# Patient Record
Sex: Male | Born: 1960 | Race: White | Hispanic: No | Marital: Married | Smoking: Former smoker
Health system: Southern US, Community
[De-identification: ages and names within clinical notes are randomized; demographics above are authoritative.]

## PROBLEM LIST (undated history)

## (undated) DIAGNOSIS — E119 Type 2 diabetes mellitus without complications: Secondary | ICD-10-CM

## (undated) DIAGNOSIS — M51369 Other intervertebral disc degeneration, lumbar region without mention of lumbar back pain or lower extremity pain: Secondary | ICD-10-CM

## (undated) DIAGNOSIS — I10 Essential (primary) hypertension: Secondary | ICD-10-CM

## (undated) DIAGNOSIS — G4730 Sleep apnea, unspecified: Secondary | ICD-10-CM

## (undated) DIAGNOSIS — D0461 Carcinoma in situ of skin of right upper limb, including shoulder: Secondary | ICD-10-CM

## (undated) DIAGNOSIS — M5136 Other intervertebral disc degeneration, lumbar region: Secondary | ICD-10-CM

## (undated) DIAGNOSIS — G629 Polyneuropathy, unspecified: Secondary | ICD-10-CM

## (undated) DIAGNOSIS — K219 Gastro-esophageal reflux disease without esophagitis: Secondary | ICD-10-CM

## (undated) DIAGNOSIS — G2581 Restless legs syndrome: Secondary | ICD-10-CM

## (undated) HISTORY — PX: SHOULDER ARTHROSCOPY: SHX128

---
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
## 1983-02-13 HISTORY — PX: APPENDECTOMY: SHX54

## 2008-02-13 HISTORY — PX: DIAGNOSTIC LAPAROSCOPY: SUR761

## 2008-03-02 ENCOUNTER — Emergency Department (HOSPITAL_COMMUNITY): Admission: EM | Admit: 2008-03-02 | Discharge: 2008-03-02 | Payer: Self-pay | Admitting: Emergency Medicine

## 2008-12-02 ENCOUNTER — Ambulatory Visit: Payer: Self-pay | Admitting: Surgery

## 2009-01-28 ENCOUNTER — Ambulatory Visit: Payer: Self-pay | Admitting: Surgery

## 2009-10-13 ENCOUNTER — Emergency Department: Payer: Self-pay | Admitting: Emergency Medicine

## 2009-12-22 ENCOUNTER — Emergency Department (HOSPITAL_COMMUNITY)
Admission: EM | Admit: 2009-12-22 | Discharge: 2009-12-22 | Payer: Self-pay | Source: Home / Self Care | Admitting: Emergency Medicine

## 2010-01-19 ENCOUNTER — Ambulatory Visit
Admission: RE | Admit: 2010-01-19 | Discharge: 2010-01-20 | Payer: Self-pay | Source: Home / Self Care | Attending: Orthopedic Surgery | Admitting: Orthopedic Surgery

## 2010-04-25 LAB — BASIC METABOLIC PANEL
Chloride: 103 mEq/L (ref 96–112)
Creatinine, Ser: 0.84 mg/dL (ref 0.4–1.5)
GFR calc Af Amer: >60 mL/min (ref >60)
Sodium: 135 mEq/L (ref 135–145)

## 2010-04-25 LAB — POCT HEMOGLOBIN-HEMACUE: Hemoglobin: 15.6 g/dL (ref 13.0–17.0)

## 2010-05-28 ENCOUNTER — Emergency Department: Payer: Self-pay | Admitting: Emergency Medicine

## 2010-05-29 LAB — COMPREHENSIVE METABOLIC PANEL
AST: 25 U/L (ref 0–37)
Albumin: 4.1 g/dL (ref 3.5–5.2)
Alkaline Phosphatase: 75 U/L (ref 39–117)
BUN: 15 mg/dL (ref 6–23)
Chloride: 109 mEq/L (ref 96–112)
GFR calc Af Amer: >60 mL/min (ref >60)
Potassium: 4.4 mEq/L (ref 3.5–5.1)
Total Bilirubin: 0.8 mg/dL (ref 0.3–1.2)

## 2010-05-29 LAB — CBC
HCT: 43.6 % (ref 39.0–52.0)
Platelets: 314 10*3/uL (ref 150–400)
RBC: 4.76 MIL/uL (ref 4.22–5.81)
WBC: 17.4 10*3/uL — ABNORMAL HIGH (ref 4.0–10.5)

## 2010-05-29 LAB — DIFFERENTIAL
Basophils Absolute: 0.2 10*3/uL — ABNORMAL HIGH (ref 0.0–0.1)
Basophils Relative: 1 % (ref 0–1)
Eosinophils Relative: 2 % (ref 0–5)
Monocytes Absolute: 1.4 10*3/uL — ABNORMAL HIGH (ref 0.1–1.0)

## 2010-07-03 ENCOUNTER — Emergency Department: Payer: Self-pay | Admitting: Emergency Medicine

## 2010-09-25 ENCOUNTER — Encounter (HOSPITAL_BASED_OUTPATIENT_CLINIC_OR_DEPARTMENT_OTHER)
Admission: RE | Admit: 2010-09-25 | Discharge: 2010-09-25 | Disposition: A | Discharge status: Home / Self Care | Payer: Worker's Compensation | Source: Ambulatory Visit | Attending: Orthopedic Surgery | Admitting: Orthopedic Surgery

## 2010-09-25 LAB — BASIC METABOLIC PANEL
BUN: 9 mg/dL (ref 6–23)
Creatinine, Ser: 0.9 mg/dL (ref 0.50–1.35)
GFR calc Af Amer: >60 mL/min (ref >60)
GFR calc non Af Amer: >60 mL/min (ref >60)

## 2010-09-28 ENCOUNTER — Ambulatory Visit (HOSPITAL_BASED_OUTPATIENT_CLINIC_OR_DEPARTMENT_OTHER)
Admission: RE | Admit: 2010-09-28 | Discharge: 2010-09-28 | Disposition: A | Discharge status: Home / Self Care | Payer: Worker's Compensation | Source: Ambulatory Visit | Attending: Orthopedic Surgery | Admitting: Orthopedic Surgery

## 2010-09-28 DIAGNOSIS — J4489 Other specified chronic obstructive pulmonary disease: Secondary | ICD-10-CM | POA: Insufficient documentation

## 2010-09-28 DIAGNOSIS — M67919 Unspecified disorder of synovium and tendon, unspecified shoulder: Secondary | ICD-10-CM | POA: Insufficient documentation

## 2010-09-28 DIAGNOSIS — S43439A Superior glenoid labrum lesion of unspecified shoulder, initial encounter: Secondary | ICD-10-CM | POA: Insufficient documentation

## 2010-09-28 DIAGNOSIS — M899 Disorder of bone, unspecified: Secondary | ICD-10-CM | POA: Insufficient documentation

## 2010-09-28 DIAGNOSIS — M719 Bursopathy, unspecified: Secondary | ICD-10-CM | POA: Insufficient documentation

## 2010-09-28 DIAGNOSIS — K219 Gastro-esophageal reflux disease without esophagitis: Secondary | ICD-10-CM | POA: Insufficient documentation

## 2010-09-28 DIAGNOSIS — Z01812 Encounter for preprocedural laboratory examination: Secondary | ICD-10-CM | POA: Insufficient documentation

## 2010-09-28 DIAGNOSIS — G4733 Obstructive sleep apnea (adult) (pediatric): Secondary | ICD-10-CM | POA: Insufficient documentation

## 2010-09-28 DIAGNOSIS — E669 Obesity, unspecified: Secondary | ICD-10-CM | POA: Insufficient documentation

## 2010-09-28 DIAGNOSIS — M25819 Other specified joint disorders, unspecified shoulder: Secondary | ICD-10-CM | POA: Insufficient documentation

## 2010-09-28 DIAGNOSIS — J449 Chronic obstructive pulmonary disease, unspecified: Secondary | ICD-10-CM | POA: Insufficient documentation

## 2010-09-28 DIAGNOSIS — I10 Essential (primary) hypertension: Secondary | ICD-10-CM | POA: Insufficient documentation

## 2010-09-28 DIAGNOSIS — X58XXXA Exposure to other specified factors, initial encounter: Secondary | ICD-10-CM | POA: Insufficient documentation

## 2010-10-05 NOTE — Op Note (Signed)
  NAMEHARROLD, Steven Nielsen NO.:  [id number]  MEDICAL RECORD NO.:  [id number]  LOCATION:                                 FACILITY:  PHYSICIAN:  Loreta Ave, M.D.      DATE OF BIRTH:  DATE OF PROCEDURE:  09/28/2010 DATE OF DISCHARGE:                              OPERATIVE REPORT   PREOPERATIVE DIAGNOSES:  Left shoulder subacromial impingement. Previous open reduction internal fixation of glenoid fracture as well as arthroscopic Bankart repair.  POSTOPERATIVE DIAGNOSES:  Left shoulder subacromial impingement. Previous open reduction internal fixation of  glenoid fracture as well as arthroscopic Bankart repair with healed fracture in good position. Solid reconstruction.  Now with some complex tearing in the posterior and superior labrum.  Marked subacromial bursitis and distal clavicle osteolysis.  PROCEDURE:  Left shoulder exam under anesthesia arthroscopy.  Assessment of healing of reconstruction and fracture fixation.  Debridement of labrum and adhesions.  Bursectomy, acromioplasty, CA ligament release. Excision of distal clavicle.  Debridement of superior rotator cuff.  SURGEON:  Loreta Ave, MD  ASSISTANT:  Zonia Kief, PA, present throughout the entire case and necessary for timely completion of procedure.  ANESTHESIA:  General  BLOOD LOSS:  Minimal.  SPECIMENS:  None.  COUNTS:  None.  COMPLICATIONS:  None.  DRESSING:  Soft compressive with sling.  PROCEDURE IN DETAILS:  The patient was brought to the operating room and placed on the operating room table in supine position.  After adequate anesthesia has been obtained, shoulder examined.  Stiff but I can get him through fairly good motion without too much trouble.  This is just global stiffness rather than true adhesions.  Placed in beach-chair position on the shoulder positioner, prepped and draped in usual sterile fashion.  Three portals anterior, posterior and lateral.   Arthroscope was introduced.  Shoulder distended and inspected.  Glenoid fracture healed in anatomic position.  Nice and solid without degenerative changes at present.  Complex tearing, posterior and superior labrum debrided.  Remaining articular cartilage looked good.  Bankart reconstruction looked good.  Undersurface of the cuff, biceps tendon, and biceps anchor intact.  Some early adhesions debrided out.  Cannula redirected subacromially.  Type 3 acromion, reactive bursitis, subacromial adhesions and inflammatory debris.  Bursa resected adhesions and inflamed tissue removed.  Cuff debrided.  Some abrasion on the top but no structural tearing.  CA ligament released.  Acromioplasty to a type 1 acromion with a shaver and high-speed bur.  Marked osteolysis grade 3 and 4 changes distal clavicle.  Periarticular spurs and the lateral centimeter of clavicle resected.  Adequacy of decompression confirmed viewing from all portals.  Instruments and fluid removed. Portals closed with nylon.  Sterile compressive dressing applied. Anesthesia reversed.  Brought to the recovery room.  Tolerated the surgery well.  No complications.     Loreta Ave, M.D.     DFM/MEDQ  D:  09/28/2010  T:  09/29/2010  Job:  [id number]  Electronically Signed by Mckinley Jewel M.D. on 10/05/2010 04:15:30 PM

## 2011-02-13 DIAGNOSIS — M109 Gout, unspecified: Secondary | ICD-10-CM

## 2011-02-13 HISTORY — PX: SHOULDER ARTHROSCOPY: SHX128

## 2011-02-13 HISTORY — DX: Gout, unspecified: M10.9

## 2011-03-14 ENCOUNTER — Ambulatory Visit: Payer: Self-pay | Admitting: Family Medicine

## 2013-03-05 DIAGNOSIS — I10 Essential (primary) hypertension: Secondary | ICD-10-CM | POA: Insufficient documentation

## 2013-03-05 DIAGNOSIS — M5136 Other intervertebral disc degeneration, lumbar region: Secondary | ICD-10-CM | POA: Insufficient documentation

## 2013-03-05 DIAGNOSIS — M51369 Other intervertebral disc degeneration, lumbar region without mention of lumbar back pain or lower extremity pain: Secondary | ICD-10-CM | POA: Insufficient documentation

## 2013-03-06 DIAGNOSIS — G6289 Other specified polyneuropathies: Secondary | ICD-10-CM | POA: Insufficient documentation

## 2013-03-08 DIAGNOSIS — E291 Testicular hypofunction: Secondary | ICD-10-CM | POA: Insufficient documentation

## 2013-11-16 HISTORY — PX: COLONOSCOPY: SHX174

## 2016-03-02 DIAGNOSIS — G43109 Migraine with aura, not intractable, without status migrainosus: Secondary | ICD-10-CM | POA: Insufficient documentation

## 2016-03-13 DIAGNOSIS — G4733 Obstructive sleep apnea (adult) (pediatric): Secondary | ICD-10-CM | POA: Insufficient documentation

## 2016-03-13 DIAGNOSIS — E669 Obesity, unspecified: Secondary | ICD-10-CM | POA: Insufficient documentation

## 2016-04-09 ENCOUNTER — Other Ambulatory Visit: Payer: Self-pay | Admitting: Neurology

## 2016-04-09 DIAGNOSIS — G43109 Migraine with aura, not intractable, without status migrainosus: Principal | ICD-10-CM

## 2016-04-09 DIAGNOSIS — G43809 Other migraine, not intractable, without status migrainosus: Secondary | ICD-10-CM

## 2016-04-25 ENCOUNTER — Ambulatory Visit: Payer: Self-pay

## 2016-08-16 ENCOUNTER — Ambulatory Visit: Payer: 59 | Attending: Neurology | Admitting: Physical Therapy

## 2016-08-16 DIAGNOSIS — M542 Cervicalgia: Secondary | ICD-10-CM | POA: Diagnosis not present

## 2016-08-16 DIAGNOSIS — M2560 Stiffness of unspecified joint, not elsewhere classified: Secondary | ICD-10-CM | POA: Diagnosis present

## 2016-08-16 DIAGNOSIS — R293 Abnormal posture: Secondary | ICD-10-CM | POA: Insufficient documentation

## 2016-08-17 NOTE — Therapy (Signed)
[facility] [address] [place], [place], [address] Phone: [phone]   Fax:  [phone]  Physical Therapy Evaluation  Patient Details  Name: Steven Nielsen MRN: [id number] Date of Birth: 12/08/1960 Referring Provider: Dr. Sherryll BurgerShah  Encounter Date: 08/16/2016      PT End of Session - 08/17/16 0901    Visit Number 1   Number of Visits 8   Date for PT Re-Evaluation 09/13/16   PT Start Time 1544   PT Stop Time 1640   PT Time Calculation (min) 56 min   Activity Tolerance Patient tolerated treatment well   Behavior During Therapy [facility] for tasks assessed/performed      No past medical history on file.  No past surgical history on file.  There were no vitals filed for this visit.       Subjective Assessment - 08/16/16 1551    Subjective Pt reports dizziness and migraines with increasing frequency in the last six months. States he would have "episodes" once or twice a year, then once a month, and eventually progressed to every week. States his last major episode was 40 days ago while fishing.  Pt. reports he has a couple MD appts. with specialists scheduled in a few months.     Limitations Walking;Standing   Patient Stated Goals Decrease neck pain/ episodes of vertigo.  Return to fishing with no limitations.    Currently in Pain? Yes   Pain Score 5    Pain Location Neck   Pain Orientation Posterior   Pain Descriptors / Indicators Aching;Sore   Pain Type Chronic pain   Pain Onset More than a month ago   Pain Frequency Constant   Multiple Pain Sites No            [facility] PT Assessment - 08/17/16 0001      Assessment   Medical Diagnosis Neck pain/ Vertigo   Referring Provider Dr. Sherryll BurgerShah   Onset Date/Surgical Date 02/12/14   Hand Dominance Right   Prior Therapy Nothing for neck pain     Precautions   Precautions None     Balance Screen   Has the patient fallen in the past 6 months No     Prior Function   Level of  Independence Independent       See HEP.  Discussed sleeping posture and computer desk ergonomics.  Ice to neck in sitting after tx. For 10 min.         PT Education - 08/17/16 0901    Education provided Yes   Education Details See HEP   Person(s) Educated Patient   Methods Explanation;Demonstration;Handout   Comprehension Verbalized understanding;Returned demonstration             PT Long Term Goals - 08/17/16 0912      PT LONG TERM GOAL #1   Title Pt will decrease his NDI score to less than 10% in order to show a decrease in pain perception and increase in function   Baseline NDI: 18% on 7/5   Time 4   Period Weeks   Status New     PT LONG TERM GOAL #2   Title Pt. will increase his cervical rotation ROM to 55 degrees in order to improve function and increase ability to participate in desired activities   Baseline Cervical flexion (47 deg.), ext. (34 deg.), L rotn. (48 deg.), R rotn. (50 deg.), L lat. flex. (44 deg.), R lat. flex. (42 deg.).   Time  4   Period Weeks   Status New     PT LONG TERM GOAL #3   Title Pt will decrease his pain score to <3/10 in order to increase his ability to participate in recreational acitivities   Baseline 5/10   Time 4   Period Weeks   Status New     PT LONG TERM GOAL #4   Title Pt. will report no episodes of vertigo or dizziness with daily activities and fishing consistently for 1 month.     Time 4   Period Weeks   Status New            Plan - 08/17/16 0902    Clinical Impression Statement Pt. is a 56 y/o with chronic h/o neck pain/ vertigo symptoms resulting in nausea/ vomiting over past 2 years.  Pt. reports symptoms are more frequent and last happened while fishing 40 days ago.  Pt. reports 5/10 neck pain currently at rest.  Pt. presents with good B UE strength grossly 5/5 MMT.  Cervical flexion (47 deg.), ext. (34 deg.), L rotn. (48 deg.), R rotn. (50 deg.), L lat. flex. (44 deg.), R lat. flex. (42 deg.).  Pt. reports  increase pain with R rotn. as compared to L.  Pt. works at [employer] and is aware of proper TEFL teacher.  PT discussed sleeping posture/ head position with pillow use.  Pt. currently not participating with a HEP at this time.  NDI: 18% self-perceived mild disability.  Pt. will benefit from short-term skilled PT services to increase pain-free cervical AROM/ upright posture to decrease incidence of vertigo/dizziness.     Clinical Decision Making Moderate   Rehab Potential Fair   PT Frequency 2x / week   PT Duration 4 weeks   PT Treatment/Interventions ADLs/Self Care Home Management;Cryotherapy;Electrical Stimulation;Iontophoresis 4mg /ml Dexamethasone;Moist Heat;Therapeutic exercise;Therapeutic activities;Functional mobility training;Neuromuscular re-education;Patient/family education;Passive range of motion;Manual techniques;Dry needling;Vestibular   PT Next Visit Plan Reassess cervical AROM/ HEP.  Discuss pt. status with Mardelle Matte, PT, DPT   PT Home Exercise Plan see handouts      Patient will benefit from skilled therapeutic intervention in order to improve the following deficits and impairments:  Improper body mechanics, Pain, Postural dysfunction, Decreased mobility, Decreased activity tolerance, Decreased range of motion, Hypomobility  Visit Diagnosis: Painful cervical range of motion  Neck pain  Joint stiffness  Abnormal posture     Problem List There are no active problems to display for this patient.  Cammie Mcgee, PT, DPT # [phone] 08/17/2016, 9:24 AM  [facility] [address] [place], [place], [address] Phone: [phone]   Fax:  [phone]  Name: Steven Nielsen MRN: [id number] Date of Birth: [birth year]

## 2016-08-23 ENCOUNTER — Ambulatory Visit: Payer: 59 | Admitting: Physical Therapy

## 2016-08-23 DIAGNOSIS — M542 Cervicalgia: Secondary | ICD-10-CM | POA: Diagnosis not present

## 2016-08-23 DIAGNOSIS — R293 Abnormal posture: Secondary | ICD-10-CM

## 2016-08-23 DIAGNOSIS — M2560 Stiffness of unspecified joint, not elsewhere classified: Secondary | ICD-10-CM

## 2016-08-24 NOTE — Therapy (Addendum)
[facility] [address] [place], [place], [address] Phone: [phone]   Fax:  [phone]  Physical Therapy Treatment  Patient Details  Name: Shauna HughDavid W Stanfill MRN: [id number] Date of Birth: 12-30-1960 Referring Provider: Dr. Sherryll BurgerShah  Encounter Date: 08/23/2016      PT End of Session - 08/24/16 1811    Visit Number 2   Number of Visits 8   Date for PT Re-Evaluation 09/13/16   PT Start Time 1618   PT Stop Time 1706   PT Time Calculation (min) 48 min   Activity Tolerance Patient tolerated treatment well   Behavior During Therapy [facility] for tasks assessed/performed      No past medical history on file.  No past surgical history on file.  There were no vitals filed for this visit.      Subjective Assessment - 08/24/16 1810    Subjective Pt. reports a slight HA.  No questions with HEP.     Limitations Walking;Standing   Patient Stated Goals Decrease neck pain/ episodes of vertigo.  Return to fishing with no limitations.    Currently in Pain? No/denies      There.ex.: reviewed HEP.  Chin tucks/ seated scapular retraction.  Discussed Probation officerworkplace ergonomics.    Manual tx.:  Supine cervical stretches (UT/evator/rotn.) 4x each with static holds (as tolerated)- no increase c/o pain.  Supine manual traction/ suboccipital release technique 3x each.  STM to B UT region/ cervical paraspinals/ B posterior deltoid with shoulder stretches.  Prone grade II-III CPA/UPA (low cervical to mid-thoracic region)- 2x20 sec. Each.  Moderate hypomobility with no increase c/o pain.  Discussed ice vs. Heat.         No episodes of dizzness or vertigo since initial evaluation.  Pt. continues to present with increase neck pain with supine R cervical rotn.  Moderate hypomobility in mid to upper thoracic spine.  Good tolerance without pain with supine cervical stretches and manaul traction.  Cuing to correct seated posture/ head position.  No changes to HEP at  this time.  Pt instructed to contact PT if any increase neck pain or vertigo symptoms over next week.         PT Long Term Goals - 08/17/16 0912      PT LONG TERM GOAL #1   Title Pt will decrease his NDI score to less than 10% in order to show a decrease in pain perception and increase in function   Baseline NDI: 18% on 7/5   Time 4   Period Weeks   Status New     PT LONG TERM GOAL #2   Title Pt. will increase his cervical rotation ROM to 55 degrees in order to improve function and increase ability to participate in desired activities   Baseline Cervical flexion (47 deg.), ext. (34 deg.), L rotn. (48 deg.), R rotn. (50 deg.), L lat. flex. (44 deg.), R lat. flex. (42 deg.).   Time 4   Period Weeks   Status New     PT LONG TERM GOAL #3   Title Pt will decrease his pain score to <3/10 in order to increase his ability to participate in recreational acitivities   Baseline 5/10   Time 4   Period Weeks   Status New     PT LONG TERM GOAL #4   Title Pt. will report no episodes of vertigo or dizziness with daily activities and fishing consistently for 1 month.     Time 4  Period Weeks   Status New               Plan - 08/24/16 1812    Clinical Decision Making Moderate   Rehab Potential Fair   PT Frequency 2x / week   PT Duration 4 weeks   PT Treatment/Interventions ADLs/Self Care Home Management;Cryotherapy;Electrical Stimulation;Iontophoresis 4mg /ml Dexamethasone;Moist Heat;Therapeutic exercise;Therapeutic activities;Functional mobility training;Neuromuscular re-education;Patient/family education;Passive range of motion;Manual techniques;Dry needling;Vestibular   PT Next Visit Plan Reassess cervical AROM/ HEP.     PT Home Exercise Plan see handouts      Patient will benefit from skilled therapeutic intervention in order to improve the following deficits and impairments:  Improper body mechanics, Pain, Postural dysfunction, Decreased mobility, Decreased activity  tolerance, Decreased range of motion, Hypomobility  Visit Diagnosis: Painful cervical range of motion  Neck pain  Joint stiffness  Abnormal posture     Problem List There are no active problems to display for this patient.  Cammie Mcgee, PT, DPT # [phone] 08/24/2016, 6:13 PM  [facility] [address] [place], [place], [address] Phone: [phone]   Fax:  [phone]  Name: TORRIE LAFAVOR MRN: [id number] Date of Birth: 1960/10/29

## 2016-08-30 ENCOUNTER — Ambulatory Visit: Payer: 59 | Admitting: Physical Therapy

## 2016-08-30 DIAGNOSIS — M542 Cervicalgia: Secondary | ICD-10-CM | POA: Diagnosis not present

## 2016-08-30 DIAGNOSIS — R293 Abnormal posture: Secondary | ICD-10-CM

## 2016-08-30 DIAGNOSIS — M2560 Stiffness of unspecified joint, not elsewhere classified: Secondary | ICD-10-CM

## 2016-08-31 NOTE — Therapy (Addendum)
[place] [facility] [address] [place], [place], [address] Phone: [phone]   Fax:  [phone]  Physical Therapy Treatment  Patient Details  Name: Steven Nielsen MRN: [id number] Date of Birth: Jun 10, 1960 Referring Provider: Dr. Manuella Ghazi  Encounter Date: 08/30/2016      PT End of Session - 08/31/16 1631    Visit Number 3   Number of Visits 8   Date for PT Re-Evaluation 09/13/16   PT Start Time 5366   PT Stop Time 1709   PT Time Calculation (min) 52 min   Activity Tolerance Patient tolerated treatment well   Behavior During Therapy [facility] for tasks assessed/performed      No past medical history on file.  No past surgical history on file.  There were no vitals filed for this visit.      Subjective Assessment - 08/31/16 1630    Limitations Walking;Standing   Patient Stated Goals Decrease neck pain/ episodes of vertigo.  Return to fishing with no limitations.    Currently in Pain? No/denies      Pt. remains fearful of going fishing due to potential of having a vertigo attack.  No increase c/o pain reported with slight HA this morning.  No c/o neck pain at this time but >5/10 with increase activity.       Manual tx.:  Supine cervical stretches (UT/evator/rotn.)- 14 min. with no increase c/o pain or symptoms.  Supine manual traction/ suboccipital release technique 3x each.  STM to B UT region/ cervical paraspinals/ B posterior deltoid with shoulder stretches.  Prone grade II-III CPA/UPA (low cervical to mid-thoracic region)- 2x20 sec. Each.  Moderate hypomobility with no increase c/o pain.        Increase cervical lateral flexion/ rotn. in seated posture.  Pt. continues to present with rounded shoulder/ tight pec. musculature.  Moderate hypomobility in low cervical to mid-thoracic spine.  No symptoms of vertigo/ dizziness or nausea with position changes or manual tx.  Pt. has had no worsening of symptoms since starting PT and may  benefit from referral to Lady Deutscher, PT, DPT.  No change to HEP at this time.  Pt. instructed to remain active and contact PT if any symptoms or next week.           PT Long Term Goals - 08/30/16 1624      PT LONG TERM GOAL #1   Title Pt will decrease his NDI score to less than 10% in order to show a decrease in pain perception and increase in function   Baseline NDI: 18% on 7/5   Time 4   Period Weeks   Status On-going     PT LONG TERM GOAL #2   Title Pt. will increase his cervical rotation ROM to 55 degrees in order to improve function and increase ability to participate in desired activities   Baseline Cervical flexion (55 deg.), ext. (44 deg.), L rotn. (60 deg.), R rotn. (62 deg.), L lat. flex. (46 deg.), R lat. flex. (44 deg.).   Time 4   Period Weeks   Status Partially Met     PT LONG TERM GOAL #3   Title Pt will decrease his pain score to <3/10 in order to increase his ability to participate in recreational acitivities   Baseline 3/10 pain currently.  Pt. reports pain is better but still present.  No migraine pills required.     Time 4   Period Weeks   Status Partially Met  PT LONG TERM GOAL #4   Title Pt. will report no episodes of vertigo or dizziness with daily activities and fishing consistently for 1 month.     Time 4   Period Weeks   Status On-going               Plan - 08/31/16 1631    Clinical Presentation Stable   Clinical Decision Making Moderate   Rehab Potential Fair   PT Frequency 2x / week   PT Duration 4 weeks   PT Treatment/Interventions ADLs/Self Care Home Management;Cryotherapy;Electrical Stimulation;Iontophoresis 7m/ml Dexamethasone;Moist Heat;Therapeutic exercise;Therapeutic activities;Functional mobility training;Neuromuscular re-education;Patient/family education;Passive range of motion;Manual techniques;Dry needling;Vestibular   PT Next Visit Plan Issue new HEP   PT Home Exercise Plan see handouts      Patient will  benefit from skilled therapeutic intervention in order to improve the following deficits and impairments:  Improper body mechanics, Pain, Postural dysfunction, Decreased mobility, Decreased activity tolerance, Decreased range of motion, Hypomobility  Visit Diagnosis: Painful cervical range of motion  Neck pain  Joint stiffness  Abnormal posture     Problem List There are no active problems to display for this patient.  MPura Spice PT, DPT # [phone]/20/2018, 4:33 PM  [place] [facility] [address]Phone: [phone]  Fax:  [phone] Name: Steven BOTELHOMRN: [id number] of Birth: 6November 29, 1962

## 2016-09-06 ENCOUNTER — Ambulatory Visit: Payer: 59 | Admitting: Physical Therapy

## 2016-09-13 ENCOUNTER — Encounter: Payer: Self-pay | Admitting: Physical Therapy

## 2016-09-20 ENCOUNTER — Ambulatory Visit: Payer: 59 | Attending: Neurology | Admitting: Physical Therapy

## 2016-09-20 DIAGNOSIS — R293 Abnormal posture: Secondary | ICD-10-CM | POA: Diagnosis present

## 2016-09-20 DIAGNOSIS — M542 Cervicalgia: Secondary | ICD-10-CM | POA: Diagnosis not present

## 2016-09-20 DIAGNOSIS — M2560 Stiffness of unspecified joint, not elsewhere classified: Secondary | ICD-10-CM | POA: Diagnosis present

## 2016-09-21 NOTE — Therapy (Addendum)
[facility] [address] [place], [place], [address] Phone: [phone]   Fax:  [phone]  Physical Therapy Treatment  Patient Details  Name: Steven Nielsen MRN: [id number] Date of Birth: 18-Sep-1960 Referring Provider: Dr. Sherryll Burger  Encounter Date: 09/20/2016      PT End of Session - 09/21/16 1545    Visit Number 4   Number of Visits 8   Date for PT Re-Evaluation 10/18/16   PT Start Time 1621   PT Stop Time 1711   PT Time Calculation (min) 50 min   Activity Tolerance Patient tolerated treatment well   Behavior During Therapy [facility] for tasks assessed/performed      No past medical history on file.  No past surgical history on file.  There were no vitals filed for this visit.      Subjective Assessment - 09/21/16 1545    Limitations Walking;Standing   Patient Stated Goals Decrease neck pain/ episodes of vertigo.  Return to fishing with no limitations.    Currently in Pain? No/denies      Pt. not seen by PT since 7/19 due to increase dizziness/ nausea symptoms on 09/05/16.  Pt. reports symptoms persisted over the weekend and pt. attempted to return to work on Monday but had to leave early.  No c/o neck pain today or dizziness.         Manual tx.: Supine cervical stretches (UT/evator/rotn.)- 15 min. with no increase c/o pain or symptoms. Supine manual traction/ suboccipital release technique 3x each. STM to B UT region/ cervical paraspinals/ B posterior deltoid with shoulder stretches. Prone grade II-III CPA/UPA (low cervical to mid-thoracic region)- 2x20 sec. Each. Moderate hypomobility with no increase c/o pain.  Discussed there.ex./ HEP.        Pt. entered PT with no significant c/o neck pain and reports limited compliance with HEP secondary to recent episode of dizziness/ nausea.  Pt. is unaware of the cause of the recent dizziness when he woke up in the morning.  Pt. has seen MD and prescribed new medications.   Cervical AROM remains limited and moderate hypomobility remains with prone PA mobs.  Unable to reproduce symptoms with trunk rotations on stool while keeping head in midline/ neutral position.          PT Long Term Goals - 08/30/16 1624      PT LONG TERM GOAL #1   Title Pt will decrease his NDI score to less than 10% in order to show a decrease in pain perception and increase in function   Baseline NDI: 18% on 7/5   Time 4   Period Weeks   Status On-going     PT LONG TERM GOAL #2   Title Pt. will increase his cervical rotation ROM to 55 degrees in order to improve function and increase ability to participate in desired activities   Baseline Cervical flexion (55 deg.), ext. (44 deg.), L rotn. (60 deg.), R rotn. (62 deg.), L lat. flex. (46 deg.), R lat. flex. (44 deg.).   Time 4   Period Weeks   Status Partially Met     PT LONG TERM GOAL #3   Title Pt will decrease his pain score to <3/10 in order to increase his ability to participate in recreational acitivities   Baseline 3/10 pain currently.  Pt. reports pain is better but still present.  No migraine pills required.     Time 4   Period Weeks   Status Partially Met  PT LONG TERM GOAL #4   Title Pt. will report no episodes of vertigo or dizziness with daily activities and fishing consistently for 1 month.     Time 4   Period Weeks   Status On-going               Plan - 09/21/16 1546    Clinical Presentation Stable   Clinical Decision Making Moderate   Rehab Potential Fair   PT Frequency 2x / week   PT Duration 4 weeks   PT Treatment/Interventions ADLs/Self Care Home Management;Cryotherapy;Electrical Stimulation;Iontophoresis '4mg'$ /ml Dexamethasone;Moist Heat;Therapeutic exercise;Therapeutic activities;Functional mobility training;Neuromuscular re-education;Patient/family education;Passive range of motion;Manual techniques;Dry needling;Vestibular   PT Next Visit Plan Dorriea vestibular screen   PT Home Exercise Plan  see handouts      Patient will benefit from skilled therapeutic intervention in order to improve the following deficits and impairments:  Improper body mechanics, Pain, Postural dysfunction, Decreased mobility, Decreased activity tolerance, Decreased range of motion, Hypomobility  Visit Diagnosis: Painful cervical range of motion  Neck pain  Joint stiffness  Abnormal posture     Problem List There are no active problems to display for this patient.  Pura Spice, PT, DPT # [phone] 09/21/2016, 3:47 PM  [facility] [address] [place], [place], [address] Phone: [phone]   Fax:  [phone]  Name: Steven Nielsen MRN: [id number] Date of Birth: 1961-01-29

## 2016-09-27 ENCOUNTER — Ambulatory Visit: Payer: 59 | Admitting: Physical Therapy

## 2016-09-27 DIAGNOSIS — M2560 Stiffness of unspecified joint, not elsewhere classified: Secondary | ICD-10-CM

## 2016-09-27 DIAGNOSIS — M542 Cervicalgia: Secondary | ICD-10-CM | POA: Diagnosis not present

## 2016-09-27 DIAGNOSIS — R293 Abnormal posture: Secondary | ICD-10-CM

## 2016-09-28 NOTE — Therapy (Addendum)
[facility], [place] [address] [place], [place], [address] Phone: [phone]   Fax:  [phone]  Physical Therapy Treatment  Patient Details  Name: Steven Nielsen MRN: [id number] Date of Birth: 22-Jun-1960 Referring Provider: Dr. Sherryll Burger  Encounter Date: 09/27/2016      PT End of Session - 09/28/16 1741    Visit Number 5   Number of Visits 8   Date for PT Re-Evaluation 10/18/16   PT Start Time 1605   PT Stop Time 1707   PT Time Calculation (min) 62 min   Activity Tolerance Patient tolerated treatment well   Behavior During Therapy [facility] for tasks assessed/performed      No past medical history on file.  No past surgical history on file.  There were no vitals filed for this visit.    Pt. new complaints.  Pt. denies neck pain today and PT is planning on screening for vestibular symptoms.        VESTIBULAR AND BALANCE EVALUATION      HISTORY:   Subjective history of current problem:    Patient reports that he first began having episodes of dizziness 7-9 years ago and states that he would usually only have an episode once a year usually in June. Patient states that then he started to have the episodes of dizziness occur 2 times a year and now it has started to occur every few months. Patient reports he had a minor episode 3 weeks ago and about 4 weeks prior to that he had a big episode and a few weeks prior to that had another significant episode while he was out on a fishing trip. Patient states that he returned to the neurologist's office, Dr. Clelia Croft, after the fishing episode trip and states he was prescribed gabapentin. Patient reports he has been taking gabapentin once a day but is supposed to start taking it twice a day and he is going to start that this weekend. Patient states that when he has an episode he tries to lie down but states he cannot close his eyes because the spinning gets worse. Patient states he lies still and cannot  get up even to use the restroom initially. Patient reports that when he experiences an episode, he takes his medications and sleeps for 18 hours and after that he still feels imbalanced, dizzy and a "hang-over feeling". Patient reports that he takes Valium, Meclizine and Salara.  Patient reports that he has seen a neurologist and has been diagnosed with vestibular migraines. Patient reports he feels he does not have typical migraines. Patient had an MRI of the brain on 05/04/2016 and per MR it was unremarkable MRI of brain. Patient reports that the brain MRI showed no abnormal findings. Patient reports he also had a cervical CT scan which revealed some cervical issues.  Patient has been seen by Dr. Elenore Rota, ENT physician in regard to his dizziness symptoms. Patient described having had a hearing test and a VNG test all were negative.   Patient reports issues with his cervical spine started about 3-4 months ago and states that he has a persistent tightness in his left upper trapezius muscle with" a pulling sensation".   Patient reports that he has been doing a Paleo diet for a year and does not eat gluten. Patient reports he has been trying the Paleo diet to see if it impacts his peripheral neuropathy or not. Patient reports he does not drink sodas, drinks regular coffee in the morning  and decaf at night. Patient reports he does not eat chocolate or drink wine.    Description of dizziness: vertigo, unsteadiness, lightheadedness, sweating; patient reports he does get aural fullness only during the last few episodes- "mostly pressure in the left ear"  Frequency: it has become more frequent and has been occurring monthly  Duration: "bad episodes last 2-3 days"; the vertigo lasts a day but then he experiences imbalance the 2nd day and 3rd day reports he feels "like a hangover"  Symptom nature: spontaneous, variable, intermittent    Provocative Factors: no triggers, but [place] the [place]  he had just sprayed on a strong smelling bug spray, but states he has never before had a trigger with smells before that he has noticed. Patient reports he cannot find a pattern. He had just bent down to get a fish and stood back up and he got the vertigo. Patient states this is the only time that it occurred with a position change. Another time he pulled out of the parking lot and he started to have an episode. Patient notices if he looks over his left shoulder he gets "a strange feeling" and avoids this movement as he is concerned that the dizziness might come on.    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 States the tinnitus existed prior to his first episodes of dizziness  Vision (last eye exam, diplopia, recent changes): denies; pt reports that he had a complete eye examination this past year and stated only a small change in his prescription with no other findings. Patient wears glasses.      EXAMINATION        COORDINATION:  Finger to Nose:  Normal  Past Pointing: Normal    MUSCULOSKELETAL SCREEN:  Cervical Spine ROM: WFL AROM cervical left, right rotation and flexion and extension but did note decreased left rotation as compared to the right. Patient reports discomfort and tightness in his left upper trap area with all of the above-mentioned cervical motions.     Gait: Patient ambulates with fair cadence without AD with fair scanning of visual environment.     Balance:  Patient demonstrates difficulty with ambulation with horizontal head turns, retro ambulation, feet together on firm and foam surfaces and increased difficulty with narrow BOS, uneven surfaces and EC activities.    POSTURAL CONTROL TESTS:    Clinical Test of Sensory Interaction for Balance     (CTSIB):    CONDITION  TIME  STRATEGY  SWAY   Eyes open, firm surface             30 seconds       Eyes closed, firm surface  30 seconds  ankle  +1   Eyes open, foam surface  30 seconds  ankle  +1   Eyes closed, foam surface  30 seconds  ankle  +2       OCULOMOTOR / VESTIBULAR TESTING:    Oculomotor Exam- Room Light    Normal  Abnormal  Comments   Ocular Alignment  normal       Ocular ROM  normal       Spontaneous Nystagmus  normal       End-Gaze Nystagmus  normal  Smooth Pursuit  N       Saccades    Abn  Hypometric saccades   VOR    Abn  Dizziness and blurring of target   VOR Cancellation  N       Left Head Thrust      Deferred secondary to neck discomfort   Right Head Thrust      Deferred secondary to neck discomfort   Head Shaking Nystagmus    Abn  4-5/10 dizziness without nystagmus observed       BPPV TESTS:    Symptoms  Duration  Intensity  Nystagmus   L Dix-Hallpike  none  N/A    None observed   R Dix-Hallpike  none    N/A      None observed   L Head Roll  none  N/A    None observed   R Head Roll  none  N/A    None observed     Neuromuscular Re-education:  Performed DGI and patient scored 21/24. Patient scored a 1 on item # 3. as patient demonstrated veering and change in gait speed with ambulation with horizontal head turns and scored a 2 on item #8. As he used rail with stairs.  Ambulation with head turns:   Patient performed 150' forwards ambulation while scanning for targets in hallway and patient denied increase in symptoms. Patient performed 150' trials of forwards ambulation with horizontal and vertical head turns with CGA. Patient performed 150' trial of retro ambulation with horizontal head turns with CGA. Patient reported dizziness and noted multiple episodes of veering and imbalance that patient was able to self-correct.   Body Wall Rolls:   Patient performed 2 reps of supported, body wall rolls with eyes open and one rep with EC. Patient reports  increase in dizziness with this activity and noted imbalance.   VOR X 1 exercise:   Demonstrated and educated as to VOR X1.   Patient performed VOR X 1 horizontal in sitting 2 reps of 1 minute each with verbal cues for technique.   Patient reports the target is blurring at times and needed to decrease speed and reports 4/10 dizziness.   Issued VOR X 1 for HEP. Handout provided. Information from [email or web address] provided on vestibular migraines including migraine diet.          PT Long Term Goals - 08/30/16 1624      PT LONG TERM GOAL #1   Title Pt will decrease his NDI score to less than 10% in order to show a decrease in pain perception and increase in function   Baseline NDI: 18% on 7/5   Time 4   Period Weeks   Status On-going     PT LONG TERM GOAL #2   Title Pt. will increase his cervical rotation ROM to 55 degrees in order to improve function and increase ability to participate in desired activities   Baseline Cervical flexion (55 deg.), ext. (44 deg.), L rotn. (60 deg.), R rotn. (62 deg.), L lat. flex. (46 deg.), R lat. flex. (44 deg.).   Time 4   Period Weeks   Status Partially Met     PT LONG TERM GOAL #3   Title Pt will decrease his pain score to <3/10 in order to increase his ability to participate in recreational acitivities   Baseline 3/10 pain currently.  Pt. reports pain is better but still present.  No migraine pills required.     Time 4   Period Weeks  Status Partially Met     PT LONG TERM GOAL #4   Title Pt. will report no episodes of vertigo or dizziness with daily activities and fishing consistently for 1 month.     Time 4   Period Weeks   Status On-going       Patient will benefit from skilled therapeutic intervention in order to improve the following deficits and impairments:     Visit Diagnosis: Painful cervical range of motion  Neck pain  Joint stiffness  Abnormal posture     Problem List There are no active problems to display for this  patient.  Pura Spice, PT, DPT # [id number] 09/28/2016, 5:43 PM  [facility] [address] [place], [place], [address] Phone: [phone]   Fax:  [phone]  Name: Steven Nielsen MRN: [id number] Date of Birth: 08-Jan-1961

## 2016-10-02 ENCOUNTER — Ambulatory Visit: Payer: 59 | Admitting: Physical Therapy

## 2016-10-02 DIAGNOSIS — M542 Cervicalgia: Secondary | ICD-10-CM | POA: Diagnosis not present

## 2016-10-02 DIAGNOSIS — R293 Abnormal posture: Secondary | ICD-10-CM

## 2016-10-02 DIAGNOSIS — M2560 Stiffness of unspecified joint, not elsewhere classified: Secondary | ICD-10-CM

## 2016-10-03 NOTE — Therapy (Addendum)
[facility] [address] [place], [place], [address] Phone: [phone]   Fax:  [phone]  Physical Therapy Treatment  Patient Details  Name: CRAWFORD TAMURA MRN: [id number] Date of Birth: [birth year] Referring Provider: Dr. Manuella Ghazi  Encounter Date: 10/02/2016      PT End of Session - 10/03/16 1728    Visit Number 6   Number of Visits 8   Date for PT Re-Evaluation 10/18/16   PT Start Time 1625   PT Stop Time 1720   PT Time Calculation (min) 55 min   Activity Tolerance Patient tolerated treatment well   Behavior During Therapy [facility] for tasks assessed/performed      No past medical history on file.  No past surgical history on file.  There were no vitals filed for this visit.      Subjective Assessment - 10/03/16 1720    Subjective Pt. reports feeling tight in neck/upper back over past couple days.  No episodes of dizziness/ nausea after last tx./ vestibular screen.     Limitations Walking;Standing   Patient Stated Goals Decrease neck pain/ episodes of vertigo.  Return to fishing with no limitations.    Currently in Pain? No/denies        Manual tx.: Supine cervical stretches (UT/evator/rotn.)- 15 min. withno increase c/o pain or symptoms. Supine manual traction/ suboccipital release technique 3x each. STM to B UT region/ cervical paraspinals/ B posterior deltoid with shoulder stretches. Prone grade II-III CPA/UPA (low cervical to mid-thoracic region)- 2x20 sec. Each. Moderate hypomobility with no increase c/o pain.  Discussed there.ex./ HEP.        Moderate upper/mid-thoracic hypomobility with no pain reported during manual tx./ mobs.  Good understanding of HEP and posture correction.  PT encouraged pt. to continue with daily HEP/ vestibular ex. from [place].  No HA or pain reported during tx. session.         PT Long Term Goals - 08/30/16 1624      PT LONG TERM GOAL #1   Title Pt will decrease his NDI score  to less than 10% in order to show a decrease in pain perception and increase in function   Baseline NDI: 18% on 7/5   Time 4   Period Weeks   Status On-going     PT LONG TERM GOAL #2   Title Pt. will increase his cervical rotation ROM to 55 degrees in order to improve function and increase ability to participate in desired activities   Baseline Cervical flexion (55 deg.), ext. (44 deg.), L rotn. (60 deg.), R rotn. (62 deg.), L lat. flex. (46 deg.), R lat. flex. (44 deg.).   Time 4   Period Weeks   Status Partially Met     PT LONG TERM GOAL #3   Title Pt will decrease his pain score to <3/10 in order to increase his ability to participate in recreational acitivities   Baseline 3/10 pain currently.  Pt. reports pain is better but still present.  No migraine pills required.     Time 4   Period Weeks   Status Partially Met     PT LONG TERM GOAL #4   Title Pt. will report no episodes of vertigo or dizziness with daily activities and fishing consistently for 1 month.     Time 4   Period Weeks   Status On-going            Plan - 10/03/16 1729    Clinical Presentation  Stable   Clinical Decision Making Moderate   Rehab Potential Fair   PT Frequency 2x / week   PT Duration 4 weeks   PT Treatment/Interventions ADLs/Self Care Home Management;Cryotherapy;Electrical Stimulation;Iontophoresis '4mg'$ /ml Dexamethasone;Moist Heat;Therapeutic exercise;Therapeutic activities;Functional mobility training;Neuromuscular re-education;Patient/family education;Passive range of motion;Manual techniques;Dry needling;Vestibular   PT Next Visit Plan Reassess cervical ROM/ mobility.  Progress HEP.     PT Home Exercise Plan see handouts      Patient will benefit from skilled therapeutic intervention in order to improve the following deficits and impairments:  Improper body mechanics, Pain, Postural dysfunction, Decreased mobility, Decreased activity tolerance, Decreased range of motion,  Hypomobility  Visit Diagnosis: Painful cervical range of motion  Neck pain  Joint stiffness  Abnormal posture     Problem List There are no active problems to display for this patient.  Pura Spice, PT, DPT # [id number] 10/03/2016, 5:30 PM  [facility] [address] [place], [place], [address] Phone: [phone]   Fax:  [phone]  Name: BRENTYN SEEHAFER MRN: [id number] Date of Birth: 01/18/1961

## 2016-10-11 ENCOUNTER — Ambulatory Visit: Payer: 59 | Admitting: Physical Therapy

## 2016-10-11 DIAGNOSIS — R293 Abnormal posture: Secondary | ICD-10-CM

## 2016-10-11 DIAGNOSIS — M542 Cervicalgia: Secondary | ICD-10-CM | POA: Diagnosis not present

## 2016-10-11 DIAGNOSIS — M2560 Stiffness of unspecified joint, not elsewhere classified: Secondary | ICD-10-CM

## 2016-10-12 NOTE — Therapy (Addendum)
[facility] [address] [place], [place], [address] Phone: [phone]   Fax:  [phone]  Physical Therapy Treatment  Patient Details  Name: ZOHAIB HEENEY MRN: [id number] Date of Birth: 1961/01/18 Referring Provider: Dr. Manuella Ghazi  Encounter Date: 10/11/2016      PT End of Session - 10/12/16 1631    Visit Number 7   Number of Visits 8   Date for PT Re-Evaluation 10/18/16   PT Start Time 5643   PT Stop Time 3295   PT Time Calculation (min) 53 min   Activity Tolerance Patient tolerated treatment well   Behavior During Therapy [facility] for tasks assessed/performed      No past medical history on file.  No past surgical history on file.  There were no vitals filed for this visit.      Subjective Assessment - 10/12/16 1629    Subjective No episdoes of dizziness or nausea over past week.  Pt. compliant with cervical stretches.     Limitations Walking;Standing   Patient Stated Goals Decrease neck pain/ episodes of vertigo.  Return to fishing with no limitations.    Currently in Pain? No/denies      There.ex.: Supine pec stretches/ horizontal sh. Abduction 10x with holds.  No pain.   Chin tucks in supine (manual feedback).   Manual tx.:  Supine cervical stretches (UT/evator/rotn.)- 9 min. withno increase c/o pain or symptoms. Supine manual traction/ suboccipital release technique 3x each. STM to B UT region/ cervical paraspinals/ B posterior deltoid with shoulder stretches. Prone grade II-III CPA/UPA (low cervical to mid-thoracic region)- 2x20 sec. Each. Hypomobility present.     PT reviewed current vestibular ex. Program.  Will benefit from f/u with Dorriea.      Pt. has shown a slow but consistent increase in cervical AROM all planes of movement, esp. rotn.  Moderate upper to mid.-thoracic hypomobility noted during PA mobs. in prone position.  No increase c/o pain or radicular symptoms noted.  Pt. will benefit from a f/u  visit with Lady Deutscher to progress vestibular ex. program.         PT Long Term Goals - 08/30/16 1624      PT LONG TERM GOAL #1   Title Pt will decrease his NDI score to less than 10% in order to show a decrease in pain perception and increase in function   Baseline NDI: 18% on 7/5   Time 4   Period Weeks   Status On-going     PT LONG TERM GOAL #2   Title Pt. will increase his cervical rotation ROM to 55 degrees in order to improve function and increase ability to participate in desired activities   Baseline Cervical flexion (55 deg.), ext. (44 deg.), L rotn. (60 deg.), R rotn. (62 deg.), L lat. flex. (46 deg.), R lat. flex. (44 deg.).   Time 4   Period Weeks   Status Partially Met     PT LONG TERM GOAL #3   Title Pt will decrease his pain score to <3/10 in order to increase his ability to participate in recreational acitivities   Baseline 3/10 pain currently.  Pt. reports pain is better but still present.  No migraine pills required.     Time 4   Period Weeks   Status Partially Met     PT LONG TERM GOAL #4   Title Pt. will report no episodes of vertigo or dizziness with daily activities and fishing consistently for 1  month.     Time 4   Period Weeks   Status On-going               Plan - 10/12/16 1632    Clinical Presentation Stable   Clinical Decision Making Moderate   Rehab Potential Fair   PT Frequency 2x / week   PT Duration 4 weeks   PT Treatment/Interventions ADLs/Self Care Home Management;Cryotherapy;Electrical Stimulation;Iontophoresis 65m/ml Dexamethasone;Moist Heat;Therapeutic exercise;Therapeutic activities;Functional mobility training;Neuromuscular re-education;Patient/family education;Passive range of motion;Manual techniques;Dry needling;Vestibular   PT Next Visit Plan Pt. will see Dorriea next tx. session.  Determine if further tx. sessions needed vs. indepedent program.  Recert vs. discharge next tx. session.  Check insurance/ out of pocket per  pt. request.    PT Home Exercise Plan see handouts      Patient will benefit from skilled therapeutic intervention in order to improve the following deficits and impairments:  Improper body mechanics, Pain, Postural dysfunction, Decreased mobility, Decreased activity tolerance, Decreased range of motion, Hypomobility  Visit Diagnosis: Painful cervical range of motion  Neck pain  Joint stiffness  Abnormal posture     Problem List There are no active problems to display for this patient.  MPura Spice PT, DPT # [id number]/31/2018, 4:34 PM  [facility] [facility] [address]Phone: [phone]  Fax:  [phone] Name: DMICHAI DIEPPAMRN: [id number] of Birth: [birth year]

## 2016-10-18 ENCOUNTER — Encounter: Payer: 59 | Admitting: Physical Therapy

## 2016-10-25 ENCOUNTER — Ambulatory Visit: Payer: 59 | Attending: Neurology | Admitting: Physical Therapy

## 2016-12-05 DIAGNOSIS — G2581 Restless legs syndrome: Secondary | ICD-10-CM | POA: Insufficient documentation

## 2017-08-22 ENCOUNTER — Ambulatory Visit: Payer: 59 | Admitting: Physical Therapy

## 2017-08-26 ENCOUNTER — Other Ambulatory Visit: Payer: Self-pay

## 2017-08-26 ENCOUNTER — Ambulatory Visit: Payer: 59 | Attending: Neurology | Admitting: Physical Therapy

## 2017-08-26 ENCOUNTER — Encounter: Payer: Self-pay | Admitting: Physical Therapy

## 2017-08-26 DIAGNOSIS — R293 Abnormal posture: Secondary | ICD-10-CM | POA: Insufficient documentation

## 2017-08-26 DIAGNOSIS — M542 Cervicalgia: Secondary | ICD-10-CM | POA: Insufficient documentation

## 2017-08-26 DIAGNOSIS — M2560 Stiffness of unspecified joint, not elsewhere classified: Secondary | ICD-10-CM | POA: Insufficient documentation

## 2017-08-26 NOTE — Patient Instructions (Signed)
Access Code: [email or web address]  URL: [email or web address]  Date: 08/26/2017  Prepared by: Dorene GrebeMichael Mitsugi Schrader   Exercises  Seated Assisted Cervical Rotation with Towel - 10 reps - 2 sets - 1x daily - 4x weekly  Cervical Extension AROM with Strap - 10 reps - 2 sets - 1x daily - 4x weekly  Seated Upper Trapezius Stretch - 10 reps - 2 sets - 1x daily - 4x weekly  Seated Cervical Retraction - 10 reps - 2 sets - 1x daily - 4x weekly

## 2017-08-26 NOTE — Therapy (Signed)
[facility] [address] [place], [place], [address] Phone: [phone]   Fax:  [phone]  Physical Therapy Evaluation  Patient Details  Name: Steven Nielsen MRN: [id number] Date of Birth: 09-18-1960 No data recorded  Encounter Date: 08/26/2017  PT End of Session - 08/26/17 1709    Visit Number  1    Number of Visits  8    Date for PT Re-Evaluation  09/23/17    PT Start Time  1557    PT Stop Time  1653    PT Time Calculation (min)  56 min    Activity Tolerance  Patient tolerated treatment well    Behavior During Therapy  [facility] for tasks assessed/performed       History reviewed. No pertinent past medical history.  History reviewed. No pertinent surgical history.  There were no vitals filed for this visit.   Subjective Assessment - 08/26/17 1557    Subjective  Pt. is pleasant 57 y.o. seeking therapy for neck and back pain.  PT assisted last year, however vertigo consistently was worsened and pt. has been having off/on migraines consistently now.      Limitations  Walking;Standing    Patient Stated Goals  Muscles relaxed in neck and back.    Currently in Pain?  Yes    Pain Score  6     Pain Location  Neck    Pain Orientation  Left    Pain Descriptors / Indicators  Tightness    Pain Type  Chronic pain    Pain Onset  More than a month ago    Pain Frequency  Constant    Aggravating Factors   Turning, lying on pillow on that side, looking over to see blind spots when driving.    Pain Relieving Factors  Ibuprofen with pain, but it's always there.    Multiple Pain Sites  Yes    Pain Score  8    Pain Location  Back    Pain Orientation  Right;Lower    Pain Descriptors / Indicators  Sharp;Tiring    Pain Type  Acute pain    Pain Onset  In the past 7 days    Pain Frequency  Intermittent    Aggravating Factors   Sitting for prolonged periods of time.    Pain Relieving Factors  Moving.  Ice.         EVALUATION  Pain Present: 6/10 Best: 5/10 Worst: 8/10   Posture Kyphotic posture Rounded shoulders   Gait / Mobility Decreased trunk rotation Decreased arm swing Stride lengths are WNL   ROM / MMT  Cervical Flex  48 deg Ext  31 deg L Lat  31 deg   R Lat  22 deg  L Rot  41 deg R Rot  42 deg    Traps  5/5  Shoulder L R Abduction 4+ 5 Flex  4+ 5 Biceps  5 5 Triceps 4+ 4+  Grip Strength L R   89.3 88.2  Pt. Is hypomobile in thoracic spine with Grade II-III unilateral/central PA's and hypermobile in upper lumbar region. PA's did not reproduce any of sx response.      See HEP   PT Long Term Goals - 08/26/17 1758      PT LONG TERM GOAL #1   Title  Pt. will increase FOTO to 64 to signify an increase in function and pain reduction.    Baseline  FOTO: 47 on 7/15  Time  4    Period  Weeks    Status  New    Target Date  09/23/17      PT LONG TERM GOAL #2   Title  Pt. will increase his cervical rotation ROM to 55 degrees in order to improve function and increase ability to participate in desired activities    Baseline  Cervical flexion (48 deg.), ext. (31 deg.), L rotn. (41 deg.), R rotn. (42 deg.), L lat. flex. (31 deg.), R lat. flex. (22 deg.).    Time  4    Period  Weeks    Status  New    Target Date  09/23/17      PT LONG TERM GOAL #3   Title  Pt will decrease his pain score to <3/10 in order to increase his ability to participate in recreational acitivities    Baseline  6/10 pain currently.    Time  4    Period  Weeks    Status  New    Target Date  09/23/17      PT LONG TERM GOAL #4   Title  Pt. will improve MMT of shoulder flexion to be 5/5 in order to perform work duties.    Baseline  Currently 4+/5 on L side compared to R 5/5             Plan - 08/26/17 1740    Clinical Impression Statement  Pt. presents to therapy with decreased cervical ROM in all planes of movement.  Pt. has been seen for this issue prior in clinic.  Pt.  also reports dizziness when changing positions ahowever it is being managed by medication.  Pt. currently presents to therapy with decrease in strength in L UE specifically with shoulder abduction (4+), shoulder flexion (4+), and B Triceps (4+).  Pt. also has decreased mobility in the thoracic spine with hypomobility, and slight hypermobility in lumbar region.  When performing Grade II-III unilateral/central PA's, no provocation was noted.  Pt. will benefit from skilled therapy to increase ROM of cervical and thoracic spine, and increase L UE strength.    Clinical Presentation  Stable    Clinical Decision Making  Low    Rehab Potential  Fair    PT Frequency  2x / week    PT Duration  4 weeks    PT Treatment/Interventions  ADLs/Self Care Home Management;Cryotherapy;Electrical Stimulation;Iontophoresis 4mg /ml Dexamethasone;Moist Heat;Therapeutic exercise;Therapeutic activities;Functional mobility training;Neuromuscular re-education;Patient/family education;Passive range of motion;Manual techniques;Dry needling;Vestibular    PT Next Visit Plan  Assess HEP given, possibly needle    PT Home Exercise Plan  see Handouts.       Patient will benefit from skilled therapeutic intervention in order to improve the following deficits and impairments:  Improper body mechanics, Pain, Postural dysfunction, Decreased mobility, Decreased activity tolerance, Decreased range of motion, Hypomobility  Visit Diagnosis: Painful cervical range of motion  Neck pain  Joint stiffness  Abnormal posture     Problem List There are no active problems to display for this patient.  Cammie McgeeMichael C Sherk, PT, DPT # [id number] Nolon BussingJoshua Juelz Whittenberg, SPT 08/26/2017, 6:06 PM  [facility] [address] [place], [place], [address] Phone: [phone]   Fax:  [phone]  Name: Steven HughDavid W Nielsen MRN: [id number] Date of Birth: Apr 10, 1960

## 2017-08-28 ENCOUNTER — Encounter: Payer: Self-pay | Admitting: Physical Therapy

## 2017-08-28 ENCOUNTER — Ambulatory Visit: Payer: 59 | Admitting: Physical Therapy

## 2017-08-28 DIAGNOSIS — M2560 Stiffness of unspecified joint, not elsewhere classified: Secondary | ICD-10-CM

## 2017-08-28 DIAGNOSIS — M542 Cervicalgia: Secondary | ICD-10-CM

## 2017-08-28 DIAGNOSIS — R293 Abnormal posture: Secondary | ICD-10-CM

## 2017-08-28 NOTE — Therapy (Signed)
[place] [facility] [address] [place], [place], [address] Phone: [phone]   Fax:  [phone]  Physical Therapy Treatment  Patient Details  Name: Steven Nielsen MRN: [id number] Date of Birth: May 15, 1960 Referring Provider: Dr. Thomasena Edis   Encounter Date: 08/28/2017  PT End of Session - 08/28/17 1733    Visit Number  2    Number of Visits  8    Date for PT Re-Evaluation  09/23/17    PT Start Time  1557    PT Stop Time  1649    PT Time Calculation (min)  52 min    Activity Tolerance  Patient tolerated treatment well    Behavior During Therapy  [facility] for tasks assessed/performed       History reviewed. No pertinent past medical history.  History reviewed. No pertinent surgical history.  There were no vitals filed for this visit.  Subjective Assessment - 08/28/17 1720    Subjective  Pt. reports increased pain in lumbar/thoracic region after working and lifting items yesterday.  Pt. reports no new vertigo symptoms since eval and has been compliant with HEP.      Limitations  Walking;Standing    Patient Stated Goals  Muscles relaxed in neck and back.    Currently in Pain?  Yes    Pain Score  6     Pain Location  Neck    Pain Orientation  Left    Pain Descriptors / Indicators  Tightness    Pain Type  Chronic pain    Pain Onset  More than a month ago    Pain Frequency  Constant    Multiple Pain Sites  Yes    Pain Score  6    Pain Location  Back    Pain Orientation  Right;Lower    Pain Descriptors / Indicators  Sharp;Tiring    Pain Type  Acute pain    Pain Onset  1 to 4 weeks ago    Pain Frequency  Intermittent         [facility] PT Assessment - 08/28/17 0001      Assessment   Medical Diagnosis  Neck pain    Referring Provider  Dr. Thomasena Edis    Onset Date/Surgical Date  02/12/14    Hand Dominance  Right    Prior Therapy  yes      Prior Function   Level of Independence  Independent       Treatment:   Manual:  Supine B  UT Stretch 4x30sec Supine B Levator Stretch 4x30sec Supine B Shoulder Flexion with cane x10 with 3 sec hold at end range Supine B Thoracic Rotation with Yellow Ball/ Outstretched Arms x10 with 5 sec holds (overpressure applied) Seated Thoracic Extension (somewhat uncomfortable, not continued) Prone Grade II-III Joint mobilizations to thoracic and lumbar region 10 min Prone STM to UT 4 min Prone STM to Paraspinals 6 min Prone STM to Cervical Region 4 min  Dry-Needling to L and R UT trigger points in prone position with use of 4 needles.  Several muscle fasciculations with L UT dry needling and slight increase in pain which resolves quickly.  No bleeding a needling site.    Moist Heat concurrently applied with STM      PT Education - 08/28/17 0717    Education provided  Yes    Education Details  See HEP    Person(s) Educated  Patient    Methods  Explanation;Demonstration;Handout    Comprehension  Verbalized  understanding;Returned demonstration          PT Long Term Goals - 08/26/17 1758      PT LONG TERM GOAL #1   Title  Pt. will increase FOTO to 64 to signify an increase in function and pain reduction.    Baseline  FOTO: 47 on 7/15    Time  4    Period  Weeks    Status  New    Target Date  09/23/17      PT LONG TERM GOAL #2   Title  Pt. will increase his cervical rotation ROM to 55 degrees in order to improve function and increase ability to participate in desired activities    Baseline  Cervical flexion (48 deg.), ext. (31 deg.), L rotn. (41 deg.), R rotn. (42 deg.), L lat. flex. (31 deg.), R lat. flex. (22 deg.).    Time  4    Period  Weeks    Status  New    Target Date  09/23/17      PT LONG TERM GOAL #3   Title  Pt will decrease his pain score to <3/10 in order to increase his ability to participate in recreational acitivities    Baseline  6/10 pain currently.    Time  4    Period  Weeks    Status  New    Target Date  09/23/17      PT LONG TERM GOAL #4    Title  Pt. will improve MMT of shoulder flexion to be 5/5 in order to perform work duties.    Baseline  Currently 4+/5 on L side compared to R 5/5            Plan - 08/28/17 1738    Clinical Impression Statement  Pt. demonstrated increase ROM with stretches today and tolerated manual therapy well.  Pt. noted tenderness in thoracic/lumbar region along paraspinals where trigger point was located.  Pt tolerated trigger point treatment along with other manual STM to the spine.  Pt. exhibits increase ROM since eval in neck ROM.  POC will continue to be focused on manual therapy and introducing strengthening exercises once ROM WNL is achieved.    Clinical Presentation  Stable    Clinical Decision Making  Low    Rehab Potential  Fair    PT Frequency  2x / week    PT Duration  4 weeks    PT Treatment/Interventions  ADLs/Self Care Home Management;Cryotherapy;Electrical Stimulation;Iontophoresis 4mg /ml Dexamethasone;Moist Heat;Therapeutic exercise;Therapeutic activities;Functional mobility training;Neuromuscular re-education;Patient/family education;Passive range of motion;Manual techniques;Dry needling;Vestibular    PT Next Visit Plan  Needle, Assess vertigo sx's.    PT Home Exercise Plan  see Handouts.       Patient will benefit from skilled therapeutic intervention in order to improve the following deficits and impairments:  Improper body mechanics, Pain, Postural dysfunction, Decreased mobility, Decreased activity tolerance, Decreased range of motion, Hypomobility  Visit Diagnosis: Painful cervical range of motion  Neck pain  Joint stiffness  Abnormal posture     Problem List There are no active problems to display for this patient.  Cammie McgeeMichael C Sherk, PT, DPT # [id number] Nolon BussingJoshua Latisha Lasch, SPT 08/28/2017, 5:42 PM   [facility] [address] [place], [place], [address] Phone: [phone]   Fax:  [phone]  Name: Steven Nielsen MRN:  [id number] Date of Birth: [birth year]

## 2017-09-02 ENCOUNTER — Ambulatory Visit: Payer: 59

## 2017-09-02 DIAGNOSIS — M542 Cervicalgia: Secondary | ICD-10-CM | POA: Diagnosis not present

## 2017-09-02 DIAGNOSIS — R293 Abnormal posture: Secondary | ICD-10-CM

## 2017-09-02 DIAGNOSIS — M2560 Stiffness of unspecified joint, not elsewhere classified: Secondary | ICD-10-CM

## 2017-09-02 NOTE — Therapy (Cosign Needed)
[facility] [facility] [address] [place], [place], [address] Phone: [phone]   Fax:  [phone]  Physical Therapy Treatment  Patient Details  Name: Steven Nielsen MRN: [id number] Date of Birth: [birth year] Referring Provider: Dr. Thomasena Edis   Encounter Date: 09/02/2017 ` ` 1 PT End of Session - 09/02/17 1822    Visit Number  3    Number of Visits  8    Date for PT Re-Evaluation  09/23/17    PT Start Time  1558    PT Stop Time  1649    PT Time Calculation (min)  51 min    Activity Tolerance  Patient tolerated treatment well    Behavior During Therapy  [facility] for tasks assessed/performed       History reviewed. No pertinent past medical history.  History reviewed. No pertinent surgical history.  There were no vitals filed for this visit.  Subjective Assessment - 09/02/17 1645    Subjective  Pt. reports tightness and pain once again in the mid-back region.  Pt. reports decreased pain in cervical region and demonstrated increase ROM once entering the clinic.  Pt. reports that he feels better after leaving therapy, but not sure if carryover effect is great.      Limitations  Walking;Standing    Patient Stated Goals  Muscles relaxed in neck and back.    Pain Score  6    Pain Location  Back    Pain Orientation  Mid;Lower    Pain Onset  1 to 4 weeks ago    Pain Frequency  Intermittent         Treatment:  Manual History intake regarding pain and motion; Prone Grade II-III Central PA Joint mobilizations to C3-L3 30 sec. bouts x2 at each level (20 min)  Pt. reported painful sx that resolved after 2 bouts at T11-T12 vertbrae Prone Grade II-III Unilateral PA Joint mobilization to C3-L3 15 sec bouts B at each level (10 min) Prone STM to UT (3 min) Prone STM to Paraspinals (3 min) Prone STM to Cervical Region (3 min) Moist Heat concurrently applied with STM  Trigger Point Dry Needling (TDN) Education performed with patient regarding  potential benefit of TDN. Reviewed precautions and risks with patient. Reviewed special precautions/risks over lung fields which include pneumothorax. Reviewed signs and symptoms of pneumothorax and advised pt to go to ER immediately if these symptoms develop advise them of dry needling treatment. Pt provided verbal consent to treatment. TDN performed to bilateral cervical multifidi at C3 and C6 with 2, 0.25 x 60 L-type single needle placements on each side with 3 local twitch response (LTR). Pistoning technique utilized. Improved pain-free motion following intervention.              PT Education - 09/02/17 1822    Education provided  Yes    Education Details  Pt. educated to continue performing HEP in order to retain movement in cervical region and decrease pain.    Person(s) Educated  Patient    Methods  Explanation    Comprehension  Verbalized understanding          PT Long Term Goals - 08/26/17 1758      PT LONG TERM GOAL #1   Title  Pt. will increase FOTO to 64 to signify an increase in function and pain reduction.    Baseline  FOTO: 47 on 7/15    Time  4    Period  Weeks  Status  New    Target Date  09/23/17      PT LONG TERM GOAL #2   Title  Pt. will increase his cervical rotation ROM to 55 degrees in order to improve function and increase ability to participate in desired activities    Baseline  Cervical flexion (48 deg.), ext. (31 deg.), L rotn. (41 deg.), R rotn. (42 deg.), L lat. flex. (31 deg.), R lat. flex. (22 deg.).    Time  4    Period  Weeks    Status  New    Target Date  09/23/17      PT LONG TERM GOAL #3   Title  Pt will decrease his pain score to <3/10 in order to increase his ability to participate in recreational acitivities    Baseline  6/10 pain currently.    Time  4    Period  Weeks    Status  New    Target Date  09/23/17      PT LONG TERM GOAL #4   Title  Pt. will improve MMT of shoulder flexion to be 5/5 in order to perform work duties.     Baseline  Currently 4+/5 on L side compared to R 5/5            Plan - 09/02/17 1648    Clinical Impression Statement  Pt. tolerated unilateral/central PA's throughout spine well, noting tenderness in T11-T12 region.  Pt. reported decreased pain after 3 bouts of Grade II-III mobilizations to joint.  Improved pain-free cervical ROM following trigger point dry needling to cervical multifidi. Pt. educated to continue HEP exercises as cervical AROM has improved since initial eval.    Clinical Presentation  Stable    Clinical Decision Making  Low    Rehab Potential  Fair    PT Frequency  2x / week    PT Duration  4 weeks    PT Treatment/Interventions  ADLs/Self Care Home Management;Cryotherapy;Electrical Stimulation;Iontophoresis 4mg /ml Dexamethasone;Moist Heat;Therapeutic exercise;Therapeutic activities;Functional mobility training;Neuromuscular re-education;Patient/family education;Passive range of motion;Manual techniques;Dry needling;Vestibular    PT Next Visit Plan  Needle, assess cervical ROM with goniometer    PT Home Exercise Plan  see Handouts.       Patient will benefit from skilled therapeutic intervention in order to improve the following deficits and impairments:  Improper body mechanics, Pain, Postural dysfunction, Decreased mobility, Decreased activity tolerance, Decreased range of motion, Hypomobility  Visit Diagnosis: Painful cervical range of motion  Neck pain  Joint stiffness  Abnormal posture     Problem List There are no active problems to display for this patient.  This entire session was performed under direct supervision and direction of a licensed therapist/therapist assistant . I have personally read, edited and approve of the note as written.   Nolon BussingJoshua Jaynia Fendley SPT Lynnea MaizesJason D Huprich PT, DPT, GCS  Huprich,Jason, 09/03/2017, 10:47 AM  [facility] [address] [place], [place], [address] Phone:  [phone]   Fax:  [phone]  Name: Shauna HughDavid W Twaddell MRN: [id number] Date of Birth: April 16, 1960

## 2017-09-03 NOTE — Patient Instructions (Signed)

## 2017-09-04 ENCOUNTER — Ambulatory Visit: Payer: 59

## 2017-09-04 DIAGNOSIS — R293 Abnormal posture: Secondary | ICD-10-CM

## 2017-09-04 DIAGNOSIS — M542 Cervicalgia: Secondary | ICD-10-CM

## 2017-09-04 DIAGNOSIS — M2560 Stiffness of unspecified joint, not elsewhere classified: Secondary | ICD-10-CM

## 2017-09-04 NOTE — Therapy (Cosign Needed)
[place] [place] [facility] [address] [place], [place], [address] Phone: [phone]   Fax:  [phone]  Physical Therapy Treatment  Patient Details  Name: COOLIDGE GOSSARD MRN: [id number] Date of Birth: 01-19-1961 Referring Provider: Dr. Thomasena Edis   Encounter Date: 09/04/2017  PT End of Session - 09/04/17 1609    Visit Number  4    Number of Visits  8    Date for PT Re-Evaluation  09/23/17    PT Start Time  1601    PT Stop Time  1653    PT Time Calculation (min)  52 min    Activity Tolerance  Patient tolerated treatment well    Behavior During Therapy  [facility] for tasks assessed/performed       History reviewed. No pertinent past medical history.  History reviewed. No pertinent surgical history.  There were no vitals filed for this visit.  Subjective Assessment - 09/04/17 1610    Subjective  Pt. reports decreased pain in cervical region, so much that he has "not remembered the pain today".  Pt. reports that he is still experiencing pain in the mid-thoracic with thoracic rotation.  Pt. reports that PT is working and that he is feeling better as treatment is progressing.  Pt. reports that he started to experience a headache as he was driving into the parking lot.  Pt. reports this is the first headache he has had in a while.    Limitations  Walking;Standing    Patient Stated Goals  Muscles relaxed in neck and back.    Currently in Pain?  Yes    Pain Score  3     Pain Location  Back    Pain Onset  --          Treatment:  Manual History intake regarding pain and motion; Prone Grade II-III Central PA Joint mobilizations to C3-L3 30 sec. bouts x2 at each level (20 min)  Pt. Reported soreness that resolved after first bout of PA's Prone Grade II-III Unilateral PA Joint mobilization to C3-L3 15 sec bouts B at each level (10 min) Prone STM to UT (3 min) Prone STM to Paraspinals (3 min) Prone STM to Cervical Region (3 min)  Moist Heat  concurrently applied with STM   Trigger Point Dry Needling (TDN) Education performed with patient regarding potential benefit of TDN. Reviewed precautions and risks with patient. Reviewed special precautions/risks over lung fields which include pneumothorax. Reviewed signs and symptoms of pneumothorax and advised pt to go to ER immediately if these symptoms develop advise them of dry needling treatment. Pt provided verbal consent to treatment. TDN performed to right cervical multifidi at C4 and C6 with 2, 0.25 x 60 L-type single needle placements with local twitch response (LTR). 2 needle placements to L upper trap at trigger points with LTR. Pistoning technique utilized. Improved pain-free motion following intervention     PT Education - 09/05/17 1413    Education provided  Yes    Education Details  plan of care    Person(s) Educated  Patient    Methods  Explanation    Comprehension  Verbalized understanding          PT Long Term Goals - 08/26/17 1758      PT LONG TERM GOAL #1   Title  Pt. will increase FOTO to 64 to signify an increase in function and pain reduction.    Baseline  FOTO: 47 on 7/15    Time  4  Period  Weeks    Status  New    Target Date  09/23/17      PT LONG TERM GOAL #2   Title  Pt. will increase his cervical rotation ROM to 55 degrees in order to improve function and increase ability to participate in desired activities    Baseline  Cervical flexion (48 deg.), ext. (31 deg.), L rotn. (41 deg.), R rotn. (42 deg.), L lat. flex. (31 deg.), R lat. flex. (22 deg.).    Time  4    Period  Weeks    Status  New    Target Date  09/23/17      PT LONG TERM GOAL #3   Title  Pt will decrease his pain score to <3/10 in order to increase his ability to participate in recreational acitivities    Baseline  6/10 pain currently.    Time  4    Period  Weeks    Status  New    Target Date  09/23/17      PT LONG TERM GOAL #4   Title  Pt. will improve MMT of shoulder  flexion to be 5/5 in order to perform work duties.    Baseline  Currently 4+/5 on L side compared to R 5/5            Plan - 09/04/17 1709    Clinical Impression Statement  Pt. reported soreness in spine during Grade II-III Unilateral PA's however resolved during mobilizations.  Pt. tolerated therapy well today and reports that pain is consistently decreasing from therapy.  Pt. is responding well to dry needling, reporting decreased pain in L upper trap during rotation to the L.  Pt. will continue to benefit from skilled therapy to alleviate pain and strenthen area and improve ROM.     Clinical Presentation  Stable    Clinical Decision Making  Low    Rehab Potential  Fair    PT Frequency  2x / week    PT Duration  4 weeks    PT Treatment/Interventions  ADLs/Self Care Home Management;Cryotherapy;Electrical Stimulation;Iontophoresis 4mg /ml Dexamethasone;Moist Heat;Therapeutic exercise;Therapeutic activities;Functional mobility training;Neuromuscular re-education;Patient/family education;Passive range of motion;Manual techniques;Dry needling;Vestibular    PT Next Visit Plan  Assess ROM with Goni.    PT Home Exercise Plan  see Handouts.       Patient will benefit from skilled therapeutic intervention in order to improve the following deficits and impairments:  Improper body mechanics, Pain, Postural dysfunction, Decreased mobility, Decreased activity tolerance, Decreased range of motion, Hypomobility  Visit Diagnosis: Painful cervical range of motion  Neck pain  Joint stiffness  Abnormal posture     Problem List There are no active problems to display for this patient.  This entire session was performed under direct supervision and direction of a licensed therapist/therapist assistant . I have personally read, edited and approve of the note as written.   Nolon BussingJoshua Taija Mathias SPT Sharalyn InkJason D Huprich PT, DPT, GCS  Huprich,Jason 09/05/2017, 2:16 PM  [place] [facility] [address] [place], [place], [address] Phone: [phone]   Fax:  [phone]  Name: Shauna HughDavid W Buhrman MRN: [id number] Date of Birth: 05/23/1960

## 2017-09-09 ENCOUNTER — Encounter: Payer: Self-pay | Admitting: Physical Therapy

## 2017-09-09 ENCOUNTER — Ambulatory Visit: Payer: 59

## 2017-09-09 DIAGNOSIS — M2560 Stiffness of unspecified joint, not elsewhere classified: Secondary | ICD-10-CM

## 2017-09-09 DIAGNOSIS — R293 Abnormal posture: Secondary | ICD-10-CM

## 2017-09-09 DIAGNOSIS — M542 Cervicalgia: Secondary | ICD-10-CM

## 2017-09-09 NOTE — Therapy (Cosign Needed)
[facility] [address] [place], [place], [address] Phone: [phone]   Fax:  [phone]  Physical Therapy Treatment  Patient Details  Name: Steven Nielsen MRN: [id number] Date of Birth: 1961/01/01 Referring Provider: Dr. Thomasena Edisollins   Encounter Date: 09/09/2017  PT End of Session - 09/09/17 1606    Visit Number  5    Number of Visits  8    Date for PT Re-Evaluation  09/23/17    PT Start Time  1559    PT Stop Time  1649    PT Time Calculation (min)  50 min    Activity Tolerance  Patient tolerated treatment well    Behavior During Therapy  [facility]       History reviewed. No pertinent past medical history.  History reviewed. No pertinent surgical history.  There were no vitals filed for this visit.  Subjective Assessment - 09/09/17 1558    Subjective  Pt. reports no increase in pain since last visit.  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  Pt. reports back pain in mid-thoracic region, however pt. reports to be from working on boat over th weekend.    Limitations  Walking;Standing    Patient Stated Goals  Muscles relaxed in neck and back.    Currently in Pain?  Yes    Pain Score  4     Pain Location  Back    Pain Orientation  Left    Pain Descriptors / Indicators  Tightness    Pain Onset  More than a month ago          Treatment:  Manual History intake regarding pain and motion; Prone Grade II-IIICentral PAJoint mobilizations toC3-L3 30 sec. bouts x2 at each level (20 min)             Pt. Reported soreness that resolved after first bout of PA's Prone Grade II-III Unilateral PA Joint mobilization to C3-L3 15 sec bouts B at each level (10 min) Prone STM to Paraspinals(404min) Prone STM to Cervical Region(823min) Supine B UT Stretch 4x30sec Supine B Levator Stretch 4x30sec Supine Occipital Release 4x30 sec  Moist Heat concurrently applied with STM   Trigger Point  Dry Needling (TDN) Education performed with patient regarding potential benefit of TDN. Reviewed precautions and risks with patient. Pt provided verbal consent to treatment. TDN performed toleft upper trapwith 3,0.25 x 60L-typesingle needle placementswithlocal twitch response (LTR). Pistoning technique utilized. Improved pain-free motion following intervention.          PT Long Term Goals - 08/26/17 1758      PT LONG TERM GOAL #1   Title  Pt. will increase FOTO to 64 to signify an increase in function and pain reduction.    Baseline  FOTO: 47 on 7/15    Time  4    Period  Weeks    Status  New    Target Date  09/23/17      PT LONG TERM GOAL #2   Title  Pt. will increase his cervical rotation ROM to 55 degrees in order to improve function and increase ability to participate in desired activities    Baseline  Cervical flexion (48 deg.), ext. (31 deg.), L rotn. (41 deg.), R rotn. (42 deg.), L lat. flex. (31 deg.), R lat. flex. (22 deg.).    Time  4    Period  Weeks    Status  New    Target Date  09/23/17      PT LONG TERM GOAL #3   Title  Pt will decrease his pain score to <3/10 in order to increase his ability to participate in recreational acitivities    Baseline  6/10 pain currently.    Time  4    Period  Weeks    Status  New    Target Date  09/23/17      PT LONG TERM GOAL #4   Title  Pt. will improve MMT of shoulder flexion to be 5/5 in order to perform work duties.    Baseline  Currently 4+/5 on L side compared to R 5/5            Plan - 09/09/17 1606    Clinical Presentation  Stable    Clinical Decision Making  Low    Rehab Potential  Fair    PT Frequency  2x / week    PT Duration  4 weeks    PT Treatment/Interventions  ADLs/Self Care Home Management;Cryotherapy;Electrical Stimulation;Iontophoresis 4mg /ml Dexamethasone;Moist Heat;Therapeutic exercise;Therapeutic activities;Functional mobility training;Neuromuscular re-education;Patient/family  education;Passive range of motion;Manual techniques;Dry needling;Vestibular    PT Next Visit Plan  Assess ROM with Goni.    PT Home Exercise Plan  see Handouts.       Patient will benefit from skilled therapeutic intervention in order to improve the following deficits and impairments:  Improper body mechanics, Pain, Postural dysfunction, Decreased mobility, Decreased activity tolerance, Decreased range of motion, Hypomobility  Visit Diagnosis: Painful cervical range of motion  Neck pain  Joint stiffness  Abnormal posture     Problem List There are no active problems to display for this patient.  This entire session was performed under direct supervision and direction of a licensed therapist/therapist assistant . I have personally read, edited and approve of the note as written.   Steven Nielsen PT, DPT, GCS  Nolon Bussing, SPT 09/09/2017, 4:11 PM  [facility] [address] [place], [place], [address] Phone: [phone]   Fax:  [phone]  Name: MARCELLE Nielsen MRN: [id number] Date of Birth: [birth year]

## 2017-09-11 ENCOUNTER — Encounter: Payer: 59 | Admitting: Physical Therapy

## 2017-09-16 ENCOUNTER — Encounter: Payer: Self-pay | Admitting: Physical Therapy

## 2017-09-16 ENCOUNTER — Ambulatory Visit: Payer: 59 | Attending: Neurology | Admitting: Physical Therapy

## 2017-09-16 DIAGNOSIS — M2560 Stiffness of unspecified joint, not elsewhere classified: Secondary | ICD-10-CM | POA: Insufficient documentation

## 2017-09-16 DIAGNOSIS — R293 Abnormal posture: Secondary | ICD-10-CM | POA: Diagnosis present

## 2017-09-16 DIAGNOSIS — M542 Cervicalgia: Secondary | ICD-10-CM | POA: Insufficient documentation

## 2017-09-16 NOTE — Therapy (Signed)
[place] [facility] [address] [place], [place], [address] Phone: [phone]   Fax:  [phone]  Physical Therapy Treatment  Patient Details  Name: Steven Nielsen MRN: [id number] Date of Birth: [birth year] Referring Provider: Dr. Theda Sers   Encounter Date: 09/16/2017  PT End of Session - 09/16/17 1928    Visit Number  6    Number of Visits  8    Date for PT Re-Evaluation  09/23/17    PT Start Time  1600    PT Stop Time  1646    PT Time Calculation (min)  46 min    Activity Tolerance  Patient tolerated treatment well    Behavior During Therapy  [facility] for tasks assessed/performed       History reviewed. No pertinent past medical history.  History reviewed. No pertinent surgical history.  There were no vitals filed for this visit.  Subjective Assessment - 09/16/17 1926    Subjective  Pt. reports decreased pain since last visit, and reports soreness in back from job.  Pt. reports that movement is much better and he is not in nearly as much pain as before.    Limitations  Walking;Standing    Patient Stated Goals  Muscles relaxed in neck and back.    Currently in Pain?  Yes    Pain Score  2     Pain Location  Back    Pain Orientation  Right;Mid;Upper    Pain Descriptors / Indicators  Sore    Pain Type  Chronic pain    Pain Onset  More than a month ago    Multiple Pain Sites  No    Pain Onset  1 to 4 weeks ago          Treatment:  Manual Prone Grade II-IIICentral PAJoint mobilizations toC3-L3 several 30 sec. bouts at each level (10 min) Prone Grade II-III Unilateral PA Joint mobilization to C3-L3 15 sec several bouts B at each level (10 min) Prone STM to Lumbar and Thoracic Paraspinals(59mn) Prone STM to Cervical Region(323m) Supine B UT Stretch with MET's 4x30sec Supine B Levator Stretch with MET's 4x30sec   There Ex: Standing Trigger Point Release with use of Tennis Balls and Wall Squat 2x30 sec Standing I's  with Scapular Depression x5 Prone I's with Scapular Depression x5  Pt. Unable to perform without UT compensation Prone T's with Scapular Retraction x10  Pt. Performed well with slight compensation of UT  Pt. Has poor body awareness and received multiple bouts of visual, tactile, and verbal cues for scapular depression with there ex. However was unable to perform correctly.  Pt. Will be reassessed at next visit for increased adherence to new exercises. PT Education - 09/17/17 0743    Education provided  Yes    Education Details  Pt. educated on use of tennis balls for trigger point release.    Person(s) Educated  Patient    Methods  Explanation;Demonstration;Tactile cues    Comprehension  Verbalized understanding;Returned demonstration;Verbal cues required          PT Long Term Goals - 08/26/17 1758      PT LONG TERM GOAL #1   Title  Pt. will increase FOTO to 64 to signify an increase in function and pain reduction.    Baseline  FOTO: 47 on 7/15    Time  4    Period  Weeks    Status  New    Target Date  09/23/17  PT LONG TERM GOAL #2   Title  Pt. will increase his cervical rotation ROM to 55 degrees in order to improve function and increase ability to participate in desired activities    Baseline  Cervical flexion (48 deg.), ext. (31 deg.), L rotn. (41 deg.), R rotn. (42 deg.), L lat. flex. (31 deg.), R lat. flex. (22 deg.).    Time  4    Period  Weeks    Status  New    Target Date  09/23/17      PT LONG TERM GOAL #3   Title  Pt will decrease his pain score to <3/10 in order to increase his ability to participate in recreational acitivities    Baseline  6/10 pain currently.    Time  4    Period  Weeks    Status  New    Target Date  09/23/17      PT LONG TERM GOAL #4   Title  Pt. will improve MMT of shoulder flexion to be 5/5 in order to perform work duties.    Baseline  Currently 4+/5 on L side compared to R 5/5            Plan - 09/16/17 1928    Clinical  Impression Statement  Pt. was progressed towards more active ROM exercises that requires contraction from pt. instead of all manual skills. Pt. progressed to performing METs on cervical ROM in order to increase ROM with technique.  Pt. reported no pain with mobility and noted that it was a good stretch.  Pt. will increase active ROM in order to retain movement gained from skilled therapy.  Pt demonstrates poor body awareness and inability to avoid compensatory UT overactivation with overhead activity in supine and standing.  Will continue to address posture and body awareness in future session for decreased pain with possibility of including mirror feedback.     Clinical Presentation  Stable    Clinical Decision Making  Low    Rehab Potential  Fair    PT Frequency  2x / week    PT Duration  4 weeks    PT Treatment/Interventions  ADLs/Self Care Home Management;Cryotherapy;Electrical Stimulation;Iontophoresis '4mg'$ /ml Dexamethasone;Moist Heat;Therapeutic exercise;Therapeutic activities;Functional mobility training;Neuromuscular re-education;Patient/family education;Passive range of motion;Manual techniques;Dry needling;Vestibular    PT Next Visit Plan  Assess ROM with Goni.  Assess scapular retraction and new item given in HEP.    PT Home Exercise Plan  see Handouts.  Pt. given prone T's and manual STM with tennis balls.       Patient will benefit from skilled therapeutic intervention in order to improve the following deficits and impairments:  Improper body mechanics, Pain, Postural dysfunction, Decreased mobility, Decreased activity tolerance, Decreased range of motion, Hypomobility  Visit Diagnosis: Painful cervical range of motion  Neck pain  Joint stiffness  Abnormal posture     Problem List There are no active problems to display for this patient.   Gwenlyn Saran, SPT 09/17/2017, 11:10 AM   This entire session was performed under direct supervision and direction of a licensed  therapist/therapist assistant . I have personally read, edited and approve of the note as written. Collie Siad PT, DPT  [place] [facility] [address] [place], [place], [address] Phone: [phone]   Fax:  [phone]  Name: KEILAN NICHOL MRN: [id number] Date of Birth: September 21, 1960

## 2017-09-18 ENCOUNTER — Ambulatory Visit: Payer: 59 | Admitting: Physical Therapy

## 2017-09-18 ENCOUNTER — Encounter: Payer: Self-pay | Admitting: Physical Therapy

## 2017-09-18 DIAGNOSIS — M542 Cervicalgia: Secondary | ICD-10-CM | POA: Diagnosis not present

## 2017-09-18 DIAGNOSIS — R293 Abnormal posture: Secondary | ICD-10-CM

## 2017-09-18 DIAGNOSIS — M2560 Stiffness of unspecified joint, not elsewhere classified: Secondary | ICD-10-CM

## 2017-09-19 NOTE — Therapy (Signed)
[facility] [address] [place], [place], [address] Phone: [phone]   Fax:  [phone]  Physical Therapy Treatment  Patient Details  Name: Steven Nielsen MRN: [id number] Date of Birth: Dec 21, 1960 Referring Provider: Dr. Thomasena Edis   Encounter Date: 09/18/2017  PT End of Session - 09/18/17 1711    Visit Number  7    Number of Visits  8    Date for PT Re-Evaluation  09/23/17    PT Start Time  1552    PT Stop Time  1651    PT Time Calculation (min)  59 min    Activity Tolerance  Patient tolerated treatment well    Behavior During Therapy  [facility] for tasks assessed/performed       History reviewed. No pertinent past medical history.  History reviewed. No pertinent surgical history.  There were no vitals filed for this visit.  Subjective Assessment - 09/18/17 1708    Subjective  Pt. reports that neck pain is worse than back pain today, however is not as bad as it has been.  Pt. reports a 2/10 pain level in L UT with cervical rotation to the left.  Pt. has no big plans with weekend, however states that he will be performing HEP more frequently.    Limitations  Walking;Standing    Patient Stated Goals  Muscles relaxed in neck and back.    Currently in Pain?  Yes    Pain Score  2     Pain Location  Neck    Pain Orientation  Left;Posterior    Pain Descriptors / Indicators  Sore    Pain Type  Chronic pain    Pain Onset  More than a month ago    Multiple Pain Sites  No    Pain Onset  1 to 4 weeks ago         Treatment:  Manual  Prone Grade II-IIICentral PAJoint mobilizations toC3-L3 30 sec. bouts x2 at each level (20 min) Prone Grade II-III Unilateral PA Joint mobilization to C3-L3 15 sec bouts B at each level (10 min) Prone STM to UT(6 min) Prone STM to Paraspinals(58min) Prone STM to Cervical Region(21min)      PT Long Term Goals - 08/26/17 1758      PT LONG TERM GOAL #1   Title  Pt. will increase FOTO to  64 to signify an increase in function and pain reduction.    Baseline  FOTO: 47 on 7/15    Time  4    Period  Weeks    Status  New    Target Date  09/23/17      PT LONG TERM GOAL #2   Title  Pt. will increase his cervical rotation ROM to 55 degrees in order to improve function and increase ability to participate in desired activities    Baseline  Cervical flexion (48 deg.), ext. (31 deg.), L rotn. (41 deg.), R rotn. (42 deg.), L lat. flex. (31 deg.), R lat. flex. (22 deg.).    Time  4    Period  Weeks    Status  New    Target Date  09/23/17      PT LONG TERM GOAL #3   Title  Pt will decrease his pain score to <3/10 in order to increase his ability to participate in recreational acitivities    Baseline  6/10 pain currently.    Time  4    Period  Weeks  Status  New    Target Date  09/23/17      PT LONG TERM GOAL #4   Title  Pt. will improve MMT of shoulder flexion to be 5/5 in order to perform work duties.    Baseline  Currently 4+/5 on L side compared to R 5/5            Plan - 09/18/17 1720    Clinical Impression Statement  Pt. continues to demonstrate increased AROM of cervical movement in seated position, all with decreased pain.  Pt. was educated on probably discharge at next visit due to improvements being made.  Pt. was agreeable to change in POC.  Pt. tolerated manual therapy and demonstrates decrease in trigger points along paraspinals in lower thoracic region.  Pt's. spine is more mobile after Grade II-III central/ unilateral PA's to thoracic region.      Clinical Presentation  Stable    Clinical Decision Making  Low    Rehab Potential  Fair    PT Frequency  2x / week    PT Duration  4 weeks    PT Treatment/Interventions  ADLs/Self Care Home Management;Cryotherapy;Electrical Stimulation;Iontophoresis 4mg /ml Dexamethasone;Moist Heat;Therapeutic exercise;Therapeutic activities;Functional mobility training;Neuromuscular re-education;Patient/family education;Passive  range of motion;Manual techniques;Dry needling;Vestibular    PT Next Visit Plan  Assess ROM with Goni.  Assess scapular retraction and new item given in HEP.    PT Home Exercise Plan  see Handouts.  Pt. given prone T's and manual STM with tennis balls.       Patient will benefit from skilled therapeutic intervention in order to improve the following deficits and impairments:  Improper body mechanics, Pain, Postural dysfunction, Decreased mobility, Decreased activity tolerance, Decreased range of motion, Hypomobility  Visit Diagnosis: Painful cervical range of motion  Neck pain  Joint stiffness  Abnormal posture     Problem List There are no active problems to display for this patient.  Cammie McgeeMichael C Sherk, PT, DPT # [id number] Nolon BussingJoshua Devontaye Ground, SPT 09/19/2017, 1:13 PM  [place] [facility] [address] [place], [place], [address] Phone: [phone]   Fax:  [phone]  Name: Shauna HughDavid W Copelan MRN: [id number] Date of Birth: [birth year]

## 2017-09-23 ENCOUNTER — Ambulatory Visit: Payer: 59 | Admitting: Physical Therapy

## 2017-09-23 ENCOUNTER — Encounter: Payer: Self-pay | Admitting: Physical Therapy

## 2017-09-23 DIAGNOSIS — R293 Abnormal posture: Secondary | ICD-10-CM

## 2017-09-23 DIAGNOSIS — M542 Cervicalgia: Secondary | ICD-10-CM | POA: Diagnosis not present

## 2017-09-23 DIAGNOSIS — M2560 Stiffness of unspecified joint, not elsewhere classified: Secondary | ICD-10-CM

## 2017-09-23 NOTE — Therapy (Signed)
[place] [facility] [address] [place], [place], [address] Phone: [phone]   Fax:  [phone]  Physical Therapy Treatment  Patient Details  Name: Steven HughDavid W Nielsen MRN: [id number] Date of Birth: [birth year] Referring Provider: Dr. Thomasena Edisollins   Encounter Date: 09/23/2017    Treatment: 8 of 8.  Discharge at this time.     History reviewed. No pertinent past medical history.  History reviewed. No pertinent surgical history.  There were no vitals filed for this visit.     Pt. reports [place] and landed on his buttocks. Pt. reports no injury, however felt a little stiffer than previously. Pt. reports to have been more compliant with HEP and reports no new complaints other than slight stiffness in neck.     Treatment:  Manual  Prone Grade II-IIICentral PAJoint mobilizations toC3-L3 30 sec. bouts x2 at each level (20 min) Prone Grade II-III Unilateral PA Joint mobilization to C3-L3 15 sec bouts B at each level (10 min) Supine Stretch to Cervical Region in all planes (4 min) Supine STM to Cervical Region(726min)   Goal reassessment was conducted with ROM, and MMT with findings below.       Pt. has made significant improvement in ROm in cervical region, along with an overall decrease in reported pain upon entering into the clinic. Pt. has achieved all goals set forth and has become stronger in UE's which is necessary for line of work pt. undergoes. Pt. reminded to continue to perform HEP to retain cervical ROM gained from therapy, and patient was compliant. Pt. encouraged to contact clinic if regression occurs or pt. needs a follow-up. Pt. will be discharged at this time.  PT Long Term Goals - 09/23/17 1554      PT LONG TERM GOAL #1   Title  Pt. will increase FOTO to 64 to signify an increase in function and pain reduction.    Baseline  FOTO: 47 on 7/15; FOTO: 72 on 8/12    Time   4    Period  Weeks    Status  Achieved    Target Date  09/23/17      PT LONG TERM GOAL #2   Title  Pt. will increase his cervical rotation ROM to 55 degrees in order to improve function and increase ability to participate in desired activities.    Baseline  Cervical flexion (48 deg.), ext. (31 deg.), L rotn. (41 deg.), R rotn. (42 deg.), L lat. flex. (31 deg.), R lat. flex. (22 deg.); Cervical flexion (50 deg.), ext. (46 deg.), L rotn. (58 deg.), R rotn. (60 deg.), L lat. flex. (36 deg.), R lat. flex. (37 deg.);     Time  4    Period  Weeks    Status  Achieved    Target Date  09/23/17      PT LONG TERM GOAL #3   Title  Pt will decrease his pain score to <3/10 in order to increase his ability to participate in recreational acitivities.    Baseline  6/10 pain currently; 2/10 at rest, 4/10 with cervical rotation to the left    Time  4    Period  Weeks    Status  Achieved    Target Date  09/23/17      PT LONG TERM GOAL #4   Title  Pt. will improve MMT of shoulder flexion to be 5/5 in order to perform work duties.  Baseline  Currently 4+/5 on L side compared to R 5/5; Shoulder Flex L/R - 5/5    Time  4    Period  Weeks    Status  Achieved    Target Date  09/23/17         Patient will benefit from skilled therapeutic intervention in order to improve the following deficits and impairments:  Improper body mechanics, Pain, Postural dysfunction, Decreased mobility, Decreased activity tolerance, Decreased range of motion, Hypomobility  Visit Diagnosis: Painful cervical range of motion  Neck pain  Joint stiffness  Abnormal posture     Problem List There are no active problems to display for this patient.  Cammie McgeeMichael C Sherk, PT, DPT # [id number] Tomasa HoseJosh Kahmari Herard, SPT 09/25/2017, 5:23 PM  [place] [facility] [address] [place], [place], [address] Phone: [phone]   Fax:  [phone]  Name: Steven Nielsen MRN: [id number] Date of Birth:  [birth year]

## 2018-09-17 ENCOUNTER — Encounter: Payer: Self-pay | Admitting: Physical Therapy

## 2018-09-17 ENCOUNTER — Ambulatory Visit: Payer: Managed Care, Other (non HMO) | Attending: Neurology | Admitting: Physical Therapy

## 2018-09-17 ENCOUNTER — Other Ambulatory Visit: Payer: Self-pay

## 2018-09-17 DIAGNOSIS — M542 Cervicalgia: Secondary | ICD-10-CM | POA: Insufficient documentation

## 2018-09-17 DIAGNOSIS — R293 Abnormal posture: Secondary | ICD-10-CM | POA: Insufficient documentation

## 2018-09-17 DIAGNOSIS — M2560 Stiffness of unspecified joint, not elsewhere classified: Secondary | ICD-10-CM | POA: Insufficient documentation

## 2018-09-17 NOTE — Patient Instructions (Signed)
Access Code: [email or web address]  URL: [email or web address]  Date: 09/17/2018  Prepared by: Dorcas Carrow   Exercises  Prone Press Up on Elbows - 5 reps - 1 sets - 20 seconds hold - 1x daily - 7x weekly  Supine Shoulder Horizontal Abduction with Dumbbells - 5 reps - 1 sets - 10 seconds hold - 1x daily - 7x weekly  Standing Cervical Retraction - 5 reps - 1 sets - 3 seconds hold - 1x daily - 7x weekly  Seated Cervical Sidebending Stretch - 3 reps - 1 sets - 20 seconds hold - 1x daily - 7x weekly  Seated Levator Scapulae Stretch - 3 reps - 1 sets - 20 sseconds hold - 1x daily - 7x weekly  Scapular Retraction with Resistance - 20 reps - 1 sets - 1x daily - 7x weekly

## 2018-09-20 NOTE — Therapy (Addendum)
[place] [facility] [address] [place], [place], [address] Phone: [phone]   Fax:  [phone]  Physical Therapy Evaluation  Patient Details  Name: Steven Nielsen MRN: [id number] Date of Birth: 06/17/1960 Referring Provider (PT): Dr. Joycie Peekimothy Collins   Encounter Date: 09/17/2018  PT End of Session - 09/29/18 1102    Visit Number  1    Number of Visits  8    Date for PT Re-Evaluation  10/15/18    PT Start Time  1111    PT Stop Time  1214    PT Time Calculation (min)  63 min    Activity Tolerance  Patient tolerated treatment well    Behavior During Therapy  [facility] for tasks assessed/performed       History reviewed. No pertinent past medical history.  History reviewed. No pertinent surgical history.  There were no vitals filed for this visit.   Subjective Assessment - 09/29/18 1047    Subjective  Pt. states he quit work yesterday after dealing with increase stress/ poor working conditions.  Pt. states he has chronic tightness in neck which has worsened recently.  No c/o HA right now but pt. get HA on a regular basis.  No radicular UE symptoms reported.    Pertinent History  see medical chart.  Pt. known to PT    Limitations  Walking;Standing;House hold activities    Patient Stated Goals  Decrease neck discomfort/ pain with daily tasks.    Currently in Pain?  Yes    Pain Score  2     Pain Location  Neck    Pain Orientation  Left;Lower    Pain Descriptors / Indicators  Aching;Sore    Pain Type  Chronic pain    Pain Onset  More than a month ago    Pain Onset  1 to 4 weeks ago         EVALUATION  Pain Present: 2/10 Best: 2/10 Worst: 6/10   Posture Kyphotic posture Rounded shoulders Forward head Tight pecs   Gait / Mobility Decreased trunk rotation Decreased arm swing Stride lengths are WNL   ROM / MMT  Cervical Flex                 44 deg Ext                   40 deg L Lat                40 deg               R Lat                40 deg  L Rot                46 deg R Rot               48 deg               B UE AROM WFL and strength grossly 5/5 MMT.  Pt. Is hypomobile in thoracic spine with Grade II-III unilateral/central PA's and hypermobile in upper lumbar region. PA's did not reproduce any of sx response.  No radicular symptoms      See HEP      PT Education - 09/29/18 1102    Education provided  Yes    Education Details  See HEP    Person(s) Educated  Patient    Methods  Explanation;Demonstration;Handout          PT Long Term Goals - 09/29/18 1310      PT LONG TERM GOAL #1   Title  Pt. will complete FOTO and score set goal to improve pain-free mobility.    Baseline  FOTO    Time  4    Period  Weeks    Status  New    Target Date  10/15/18      PT LONG TERM GOAL #2   Title  Pt. will increase his cervical rotation ROM to >55 degrees in order to improve function and increase ability to participate in desired activities.    Baseline  Cervical flexion (44 deg.), ext. (45 deg.), L rotn. (46 deg.), R rotn. (48 deg.), L lat. flex. (40 deg.), R lat. flex. (40 deg.).  Several UT trigger point noted    Time  4    Period  Weeks    Status  New    Target Date  10/15/18      PT LONG TERM GOAL #3   Title  Pt. will report no neck pain with AROM (all planes) to improve daily mobility.    Baseline  Increase cervical pain with prolonged activity/ household tasks.    Time  4    Period  Weeks    Status  New    Target Date  10/15/18      PT LONG TERM GOAL #4   Title  Pt. will demonstrate proper upright seated/standing posture to improve pain-free mobility.    Baseline  Forward head/ rounded shoulder posture    Time  4    Period  Weeks    Status  New    Target Date  10/15/18         Plan - 09/29/18 1103    Clinical Impression Statement  Pt. is a pleasant 58 y/o male with chronic c/o neck stiffness/ pain.  Pt. has h/o dizziness and migraines on a regular basis.   Pt. reports persistent cervical/ upper back muscle tightness and increase neck pain with work-related tasks.  Pt. presents with decrease cervical AROM as compared to previous PT evaluation/ tx. sessions.  Moderate cervical/upper thoracic hypomobility noted in prone position during mobs.  Good B UE strength noted with no radicular symptoms reported.  Pt. will benefit from skilled PT services to increas cervical ROM/ upright posture to improve pain-free mobility.    Stability/Clinical Decision Making  Evolving/Moderate complexity    Clinical Decision Making  Moderate    Rehab Potential  Fair    PT Frequency  2x / week    PT Duration  4 weeks    PT Treatment/Interventions  ADLs/Self Care Home Management;Cryotherapy;Electrical Stimulation;Iontophoresis 4mg /ml Dexamethasone;Moist Heat;Therapeutic exercise;Therapeutic activities;Functional mobility training;Neuromuscular re-education;Patient/family education;Passive range of motion;Manual techniques;Dry needling;Vestibular    PT Next Visit Plan  Manual tx./ Dry needling to cervical paraspinals/ UT trigger points.    PT Home Exercise Plan  see Handouts.  Pt. given prone T's and manual STM with tennis balls.       Patient will benefit from skilled therapeutic intervention in order to improve the following deficits and impairments:  Improper body mechanics, Pain, Postural dysfunction, Decreased mobility, Decreased activity tolerance, Decreased range of motion, Hypomobility  Visit Diagnosis: 1. Painful cervical range of motion   2. Neck pain   3. Joint stiffness   4. Abnormal posture        Problem List There are no active problems to display for this patient.  Cammie McgeeMichael C Sherk, PT, DPT # [id number] 09/29/2018, 1:14 PM  [place] [facility] [address] [place], [place], [address] Phone: [phone]   Fax:  [phone]  Name: Steven Nielsen MRN: [id number] Date of Birth: May 08, 1960

## 2018-09-22 ENCOUNTER — Other Ambulatory Visit: Payer: Self-pay

## 2018-09-22 ENCOUNTER — Ambulatory Visit: Payer: Managed Care, Other (non HMO) | Admitting: Physical Therapy

## 2018-09-22 DIAGNOSIS — M542 Cervicalgia: Secondary | ICD-10-CM | POA: Diagnosis not present

## 2018-09-22 DIAGNOSIS — R293 Abnormal posture: Secondary | ICD-10-CM

## 2018-09-22 DIAGNOSIS — M2560 Stiffness of unspecified joint, not elsewhere classified: Secondary | ICD-10-CM

## 2018-09-24 ENCOUNTER — Ambulatory Visit: Payer: Managed Care, Other (non HMO) | Admitting: Physical Therapy

## 2018-09-24 ENCOUNTER — Other Ambulatory Visit: Payer: Self-pay

## 2018-09-24 DIAGNOSIS — M542 Cervicalgia: Secondary | ICD-10-CM | POA: Diagnosis not present

## 2018-09-24 DIAGNOSIS — M2560 Stiffness of unspecified joint, not elsewhere classified: Secondary | ICD-10-CM

## 2018-09-24 DIAGNOSIS — R293 Abnormal posture: Secondary | ICD-10-CM

## 2018-09-26 NOTE — Therapy (Addendum)
[place] [facility] [address] [place], [place], [address] Phone: [phone]   Fax:  [phone]  Physical Therapy Treatment  Patient Details  Name: Steven Nielsen MRN: [id number] Date of Birth: 1960/03/30 Referring Provider (PT): Dr. Alverda Skeans   Encounter Date: 09/22/2018  PT End of Session - 10/06/18 1005    Visit Number  2    Number of Visits  8    Date for PT Re-Evaluation  10/15/18    PT Start Time  6384    PT Stop Time  1518    PT Time Calculation (min)  53 min    Activity Tolerance  Patient tolerated treatment well    Behavior During Therapy  [facility] for tasks assessed/performed       No past medical history on file.  No past surgical history on file.  There were no vitals filed for this visit.  Subjective Assessment - 10/06/18 0739    Subjective  Pt. reports cervical stiffness, not pain prior to PT tx. session.  Pt. states he has been doing his home stretches.    Pertinent History  see medical chart.  Pt. known to PT    Limitations  Walking;Standing;House hold activities    Patient Stated Goals  Decrease neck discomfort/ pain with daily tasks.    Currently in Pain?  No/denies    Pain Onset  More than a month ago    Pain Onset  1 to 4 weeks ago        There.ex.:  Reviewed HEP Supine B shoulder abduction/ adduction with focus on pec stretches 20x.  Supine B shoulder flexion 10x2.  Chin tucks with manual feedback  Manual tx.:  Supine UT/levator stretches on L/R 4x each with static holds (as tolerated) Supine cervical traction/ suboccipital release 5x Prone STM to upper thoracic/cervical musculature  Trigger Point Dry Needling (TDN) Education performed with patient regarding potential benefit of TDN. Reviewed precautions and risks with patient. Reviewed special precautions/risks over lung fields which include pneumothorax. Reviewed signs and symptoms of pneumothorax and advised pt to go to ER immediately if these  symptoms develop advise them of dry needling treatment. Pt provided verbal consent to treatment. TDN performed to bilateral cervical multifidi at C3 and C6, as well as L UT with 3, 0.25 x 60 L-type single needle placements on each side with 2 local twitch response (LTR). Pistoning technique utilized. Improved pain-free motion following intervention.      PT Long Term Goals - 09/29/18 1310      PT LONG TERM GOAL #1   Title  Pt. will complete FOTO and score set goal to improve pain-free mobility.    Baseline  FOTO    Time  4    Period  Weeks    Status  New    Target Date  10/15/18      PT LONG TERM GOAL #2   Title  Pt. will increase his cervical rotation ROM to >55 degrees in order to improve function and increase ability to participate in desired activities.    Baseline  Cervical flexion (44 deg.), ext. (45 deg.), L rotn. (46 deg.), R rotn. (48 deg.), L lat. flex. (40 deg.), R lat. flex. (40 deg.).  Several UT trigger point noted    Time  4    Period  Weeks    Status  New    Target Date  10/15/18      PT LONG TERM GOAL #3   Title  Pt. will report no neck pain with AROM (all planes) to improve daily mobility.    Baseline  Increase cervical pain with prolonged activity/ household tasks.    Time  4    Period  Weeks    Status  New    Target Date  10/15/18      PT LONG TERM GOAL #4   Title  Pt. will demonstrate proper upright seated/standing posture to improve pain-free mobility.    Baseline  Forward head/ rounded shoulder posture    Time  4    Period  Weeks    Status  New    Target Date  10/15/18            Plan - 10/06/18 1005    Clinical Impression Statement  Moderate cervical/upper thoracic hypomobility noted in prone position during mobs.  Tactile/ verbal instruction to correct seated posture/ prevent rounded shoulder/ forward head.  2 muscle fasciculations noted during trigger point dry needling to L UT region.  No changes to HEP at this time.    Stability/Clinical  Decision Making  Evolving/Moderate complexity    Clinical Decision Making  Moderate    Rehab Potential  Fair    PT Frequency  2x / week    PT Duration  4 weeks    PT Treatment/Interventions  ADLs/Self Care Home Management;Cryotherapy;Electrical Stimulation;Iontophoresis 4mg /ml Dexamethasone;Moist Heat;Therapeutic exercise;Therapeutic activities;Functional mobility training;Neuromuscular re-education;Patient/family education;Passive range of motion;Manual techniques;Dry needling;Vestibular    PT Next Visit Plan  Manual tx./ Dry needling to cervical paraspinals/ UT trigger points.    PT Home Exercise Plan  see Handouts.  Pt. given prone T's and manual STM with tennis balls.       Patient will benefit from skilled therapeutic intervention in order to improve the following deficits and impairments:  Improper body mechanics, Pain, Postural dysfunction, Decreased mobility, Decreased activity tolerance, Decreased range of motion, Hypomobility  Visit Diagnosis: 1. Painful cervical range of motion   2. Neck pain   3. Joint stiffness   4. Abnormal posture        Problem List There are no active problems to display for this patient.  Cammie McgeeMichael C Victor Langenbach, PT, DPT # [id number] 10/06/2018, 12:35 PM  [place] [facility] [place] [facility] [address] [place], [place], [address] Phone: [phone]   Fax:  [phone]  Name: Shauna HughDavid W Ramaswamy MRN: [id number] Date of Birth: February 17, 1960

## 2018-09-26 NOTE — Therapy (Addendum)
[place] [facility] [address] [place], [place], [address] Phone: [phone]   Fax:  [phone]  Physical Therapy Treatment  Patient Details  Name: Steven Nielsen MRN: [id number] Date of Birth: 13-Jan-1961 Referring Provider (PT): Dr. Alverda Skeans   Encounter Date: 09/24/2018  PT End of Session - 10/06/18 1256    Visit Number  3    Number of Visits  8    Date for PT Re-Evaluation  10/15/18    PT Start Time  1424    PT Stop Time  1518    PT Time Calculation (min)  54 min    Activity Tolerance  Patient tolerated treatment well    Behavior During Therapy  [facility] for tasks assessed/performed       No past medical history on file.  No past surgical history on file.  There were no vitals filed for this visit.  Subjective Assessment - 10/06/18 1242    Subjective  Pt. states he is hurting today.  Pt. 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  No new complaints.    Pertinent History  see medical chart.  Pt. known to PT    Limitations  Walking;Standing;House hold activities    Patient Stated Goals  Decrease neck discomfort/ pain with daily tasks.    Currently in Pain?  Yes    Pain Score  3     Pain Location  Neck    Pain Orientation  Right;Left    Pain Onset  More than a month ago    Pain Onset  1 to 4 weeks ago          There.ex.:  Seated cervical AROM (all planes) Scap. Retraction with holds 10x2.  Seated chin tucks/ posture correction in seated and standing posture Discussed HEP  Manual tx.:  Prone grade II-III PA mobs. To low cervical/ thoracic spine (C5-T8)- unilateral 1x20 sec.  Prone STM to mid-thoracic/low cervical/ UT musculature   Trigger Point Dry Needling (TDN) Education performed with patient regarding potential benefit of TDN. Reviewed precautions and risks with patient. Reviewed special precautions/risks over lung fields which include pneumothorax. Reviewed signs and  symptoms of pneumothorax and advised pt to go to ER immediately if these symptoms develop advise them of dry needling treatment. Pt provided verbal consent to treatment. TDN performed to bilateral cervical multifidi at C3 and C6 with 2, 0.25 x 60 L-type single needle placements on each side with 3 local twitch response (LTR). Pistoning technique utilized. Improved pain-free motion following intervention.        PT Long Term Goals - 09/29/18 1310      PT LONG TERM GOAL #1   Title  Pt. will complete FOTO and score set goal to improve pain-free mobility.    Baseline  FOTO    Time  4    Period  Weeks    Status  New    Target Date  10/15/18      PT LONG TERM GOAL #2   Title  Pt. will increase his cervical rotation ROM to >55 degrees in order to improve function and increase ability to participate in desired activities.    Baseline  Cervical flexion (44 deg.), ext. (45 deg.), L rotn. (46 deg.), R rotn. (48 deg.), L lat. flex. (40 deg.), R lat. flex. (40 deg.).  Several UT trigger point noted    Time  4    Period  Weeks  Status  New    Target Date  10/15/18      PT LONG TERM GOAL #3   Title  Pt. will report no neck pain with AROM (all planes) to improve daily mobility.    Baseline  Increase cervical pain with prolonged activity/ household tasks.    Time  4    Period  Weeks    Status  New    Target Date  10/15/18      PT LONG TERM GOAL #4   Title  Pt. will demonstrate proper upright seated/standing posture to improve pain-free mobility.    Baseline  Forward head/ rounded shoulder posture    Time  4    Period  Weeks    Status  New    Target Date  10/15/18          Plan - 10/06/18 1257    Clinical Impression Statement  Several L UT trigger points noted prior to STM/ dry needling.  PT able to elicit a muscle fasciculation with needle/ pistoning technique.  Generalzied upper thoracic hypomobility noted during PA mobs.    Stability/Clinical Decision Making  Evolving/Moderate  complexity    Clinical Decision Making  Moderate    Rehab Potential  Fair    PT Frequency  2x / week    PT Duration  4 weeks    PT Treatment/Interventions  ADLs/Self Care Home Management;Cryotherapy;Electrical Stimulation;Iontophoresis 4mg /ml Dexamethasone;Moist Heat;Therapeutic exercise;Therapeutic activities;Functional mobility training;Neuromuscular re-education;Patient/family education;Passive range of motion;Manual techniques;Dry needling;Vestibular    PT Next Visit Plan  Manual tx./ Dry needling to cervical paraspinals/ UT trigger points.    PT Home Exercise Plan  see Handouts.  Pt. given prone T's and manual STM with tennis balls.       Patient will benefit from skilled therapeutic intervention in order to improve the following deficits and impairments:  Improper body mechanics, Pain, Postural dysfunction, Decreased mobility, Decreased activity tolerance, Decreased range of motion, Hypomobility  Visit Diagnosis: 1. Painful cervical range of motion   2. Neck pain   3. Joint stiffness   4. Abnormal posture        Problem List There are no active problems to display for this patient.  Cammie McgeeMichael C Sherk, PT, DPT # [phone] 10/06/2018, 1:37 PM  [place] [facility] [address] [place], [place], [address] Phone: [phone]   Fax:  [phone]  Name: Shauna HughDavid W Sar MRN: [id number] Date of Birth: [birth year]

## 2018-09-29 ENCOUNTER — Other Ambulatory Visit: Payer: Self-pay

## 2018-09-29 ENCOUNTER — Ambulatory Visit: Payer: Managed Care, Other (non HMO) | Admitting: Physical Therapy

## 2018-09-29 DIAGNOSIS — M542 Cervicalgia: Secondary | ICD-10-CM | POA: Diagnosis not present

## 2018-09-29 DIAGNOSIS — M2560 Stiffness of unspecified joint, not elsewhere classified: Secondary | ICD-10-CM

## 2018-09-29 DIAGNOSIS — R293 Abnormal posture: Secondary | ICD-10-CM

## 2018-09-29 NOTE — Addendum Note (Signed)
Addended by: Pura Spice on: 09/29/2018 01:19 PM   Modules accepted: Orders

## 2018-09-29 NOTE — Therapy (Addendum)
[place] [facility] [address] [place], [place], [address] Phone: [phone]   Fax:  [phone]  Physical Therapy Treatment  Patient Details  Name: Steven Nielsen MRN: [id number] Date of Birth: 22-Nov-1960 Referring Provider (PT): Dr. Alverda Skeans   Encounter Date: 09/29/2018  PT End of Session - 10/06/18 1353    Visit Number  4    Number of Visits  8    Date for PT Re-Evaluation  10/15/18    PT Start Time  5462    PT Stop Time  1521    PT Time Calculation (min)  53 min    Activity Tolerance  Patient tolerated treatment well    Behavior During Therapy  [facility] for tasks assessed/performed       History reviewed. No pertinent past medical history.  History reviewed. No pertinent surgical history.  There were no vitals filed for this visit.  Subjective Assessment - 10/06/18 1342    Subjective  Pt. reports a minor headache and neck stiffness/ pain.  Pt. has been doing his HEP.    Pertinent History  see medical chart.  Pt. known to PT    Limitations  Walking;Standing;House hold activities    Patient Stated Goals  Decrease neck discomfort/ pain with daily tasks.    Currently in Pain?  Yes    Pain Score  3     Pain Location  Neck    Pain Orientation  Left;Right;Lower    Pain Descriptors / Indicators  Aching    Pain Type  Chronic pain    Pain Onset  More than a month ago    Pain Onset  1 to 4 weeks ago       There.ex.:  Nautilus: 50# scapular retraction/ 30# shoulder ER 20x See new HEP  Manual tx.:  Prone grade II-III PA mobs. To low cervical/ thoracic spine (C5-T8)- unilateral 1x20 sec.  Prone STM to mid-thoracic/low cervical/ UT musculature   Trigger Point Dry Needling (TDN) Education performed with patient regarding potential benefit of TDN. Reviewed precautions and risks with patient. Reviewed special precautions/risks over lung fields which include pneumothorax. Reviewed signs and symptoms of pneumothorax and  advised pt to go to ER immediately if these symptoms develop advise them of dry needling treatment. Pt provided verbal consent to treatment. TDN performed tobilateral cervical multifidi at C3 and C6with 4 needles,0.25 x 60L-typesingle needle placementson each sidewith1local twitch response (LTR). Pistoning technique utilized. Improved pain-free motion following intervention.     PT Education - 10/06/18 1352    Education provided  Yes    Education Details  See HEP    Person(s) Educated  Patient    Methods  Explanation;Demonstration    Comprehension  Verbalized understanding;Returned demonstration          PT Long Term Goals - 09/29/18 1310      PT LONG TERM GOAL #1   Title  Pt. will complete FOTO and score set goal to improve pain-free mobility.    Baseline  FOTO    Time  4    Period  Weeks    Status  New    Target Date  10/15/18      PT LONG TERM GOAL #2   Title  Pt. will increase his cervical rotation ROM to >55 degrees in order to improve function and increase ability to participate in desired activities.    Baseline  Cervical flexion (44 deg.), ext. (45 deg.), L rotn. (46 deg.), R rotn. (48  deg.), L lat. flex. (40 deg.), R lat. flex. (40 deg.).  Several UT trigger point noted    Time  4    Period  Weeks    Status  New    Target Date  10/15/18      PT LONG TERM GOAL #3   Title  Pt. will report no neck pain with AROM (all planes) to improve daily mobility.    Baseline  Increase cervical pain with prolonged activity/ household tasks.    Time  4    Period  Weeks    Status  New    Target Date  10/15/18      PT LONG TERM GOAL #4   Title  Pt. will demonstrate proper upright seated/standing posture to improve pain-free mobility.    Baseline  Forward head/ rounded shoulder posture    Time  4    Period  Weeks    Status  New    Target Date  10/15/18            Plan - 10/06/18 1353    Clinical Impression Statement  PT added more ther.ex. to HEP to focus on  posture correction/ UE strengthening ther.ex.  Moderate hypomobility in upper back/ low cervical region during manual tx. session.  PT recommends pt. remain compliant with posture correction in car while working and be more aware of head position. See new HEP    Stability/Clinical Decision Making  Evolving/Moderate complexity    Clinical Decision Making  Moderate    Rehab Potential  Fair    PT Frequency  2x / week    PT Duration  4 weeks    PT Treatment/Interventions  ADLs/Self Care Home Management;Cryotherapy;Electrical Stimulation;Iontophoresis 4mg /ml Dexamethasone;Moist Heat;Therapeutic exercise;Therapeutic activities;Functional mobility training;Neuromuscular re-education;Patient/family education;Passive range of motion;Manual techniques;Dry needling;Vestibular    PT Next Visit Plan  Manual tx./ Dry needling to cervical paraspinals/ UT trigger points.    PT Home Exercise Plan  see Handouts.  Pt. given prone T's and manual STM with tennis balls.       Patient will benefit from skilled therapeutic intervention in order to improve the following deficits and impairments:  Improper body mechanics, Pain, Postural dysfunction, Decreased mobility, Decreased activity tolerance, Decreased range of motion, Hypomobility  Visit Diagnosis: 1. Painful cervical range of motion   2. Neck pain   3. Joint stiffness   4. Abnormal posture        Problem List There are no active problems to display for this patient.  Cammie McgeeMichael C Hildy Nicholl, PT, DPT # [phone] 10/06/2018, 2:10 PM   [facility] [address] [place], [place], [address] Phone: [phone]   Fax:  [phone]  Name: Steven HughDavid W Gebhart MRN: [id number] Date of Birth: [birth year]

## 2018-09-29 NOTE — Patient Instructions (Signed)
Access Code: [email or web address]  URL: [email or web address]  Date: 09/29/2018  Prepared by: Dorcas Carrow   Exercises  Shoulder External Rotation and Scapular Retraction with Resistance - 15 reps - 2 sets - 1x daily - 4x weekly  Standing Serratus Punch with Resistance - 15 reps - 2 sets - 1x daily - 4x weekly  Standing Shoulder Diagonal Horizontal Abduction 60/120 Degrees with Resistance - 15 reps - 2 sets - 1x daily - 4x weekly  Shoulder Horizontal Abduction with Resistance on Swiss Ball - 15 reps - 2 sets - 1x daily - 4x weekly

## 2018-10-01 ENCOUNTER — Ambulatory Visit: Payer: Managed Care, Other (non HMO) | Admitting: Physical Therapy

## 2018-10-06 ENCOUNTER — Encounter: Payer: Self-pay | Admitting: Physical Therapy

## 2018-10-06 ENCOUNTER — Ambulatory Visit: Payer: Managed Care, Other (non HMO) | Admitting: Physical Therapy

## 2018-10-06 ENCOUNTER — Other Ambulatory Visit: Payer: Self-pay

## 2018-10-06 DIAGNOSIS — M542 Cervicalgia: Secondary | ICD-10-CM | POA: Diagnosis not present

## 2018-10-06 DIAGNOSIS — R293 Abnormal posture: Secondary | ICD-10-CM

## 2018-10-06 DIAGNOSIS — M2560 Stiffness of unspecified joint, not elsewhere classified: Secondary | ICD-10-CM

## 2018-10-06 NOTE — Therapy (Signed)
[place] [facility] [address] [place], [place], [address] Phone: [phone]   Fax:  [phone]  Physical Therapy Treatment  Patient Details  Name: Steven Nielsen MRN: [id number] Date of Birth: 07-Oct-1960 Referring Provider (PT): Dr. Alverda Skeans   Encounter Date: 10/06/2018  PT End of Session - 10/06/18 1641    Visit Number  5    Number of Visits  8    Date for PT Re-Evaluation  10/15/18    PT Start Time  6962    PT Stop Time  1518    PT Time Calculation (min)  47 min    Activity Tolerance  Patient tolerated treatment well    Behavior During Therapy  [facility] for tasks assessed/performed       History reviewed. No pertinent past medical history.  History reviewed. No pertinent surgical history.  There were no vitals filed for this visit.  Subjective Assessment - 10/06/18 1640    Subjective  Pt reports 2/10 pain today. Pt reports that he had migraine over weekend and was unable to perform HEP. Pt reports he feels better today.    Pertinent History  see medical chart.  Pt. known to PT    Limitations  Walking;Standing;House hold activities    Patient Stated Goals  Decrease neck discomfort/ pain with daily tasks.    Currently in Pain?  Yes    Pain Score  2     Pain Location  Neck    Pain Orientation  Left    Pain Onset  More than a month ago    Pain Onset  1 to 4 weeks ago        Manual: Pt prone, CPAs, grade II-III mobilizatoins C7-T4 - 3x30 sec each Pt prone, STM to B upper trap, cervical and upper-thoracic paraspinals, and B rotator cuff musculature x several minutes Pt supine e upper trap and levator scap stretch - 2x30 sec each B  Therapeutic exercise: Standing chin tucks with ball - 10x with 5 sec holds. Cuing for technique Standing pec stretch - 2x30 sec. Cuing for technique Nautilus Standing rows 40# - 20x. Cuing for technique Nautilus Standing pull downs 40# - 20x. Cuing for technique Seated shoulder squeezes -  20x AROM cervical spine - all planes 2x each. Pt reports pain with cervical rotation to L that resolves once out of position.       PT Education - 10/06/18 1640    Education Details  Pt educated on technique with standing pec stretch    Person(s) Educated  Patient    Methods  Demonstration;Explanation    Comprehension  Verbalized understanding;Returned demonstration          PT Long Term Goals - 09/29/18 1310      PT LONG TERM GOAL #1   Title  Pt. will complete FOTO and score set goal to improve pain-free mobility.    Baseline  FOTO    Time  4    Period  Weeks    Status  New    Target Date  10/15/18      PT LONG TERM GOAL #2   Title  Pt. will increase his cervical rotation ROM to >55 degrees in order to improve function and increase ability to participate in desired activities.    Baseline  Cervical flexion (44 deg.), ext. (45 deg.), L rotn. (46 deg.), R rotn. (48 deg.), L lat. flex. (40 deg.), R lat. flex. (40 deg.).  Several UT trigger point noted  Time  4    Period  Weeks    Status  New    Target Date  10/15/18      PT LONG TERM GOAL #3   Title  Pt. will report no neck pain with AROM (all planes) to improve daily mobility.    Baseline  Increase cervical pain with prolonged activity/ household tasks.    Time  4    Period  Weeks    Status  New    Target Date  10/15/18      PT LONG TERM GOAL #4   Title  Pt. will demonstrate proper upright seated/standing posture to improve pain-free mobility.    Baseline  Forward head/ rounded shoulder posture    Time  4    Period  Weeks    Status  New    Target Date  10/15/18            Plan - 10/06/18 1641    Clinical Impression Statement  Pt required cuing to decrease upper trap activation with Nautilus pull downs and rows today, indicating continued compensation with B upper trap musculature and decreased strength of B lats and scapular retractors. Pt also continues to have L sided neck pain with cervical rotation  AROM to L that resolves once pt is out of position. Specifically, pt pointed to L sided pain near occiput and L cervical paraspinals. Pt also TTP from C7-T3 with central PA grade II-III mobilizations. Pt will continue to benefit from further skilled therapy to decrease cervical pain and increase strength of B shoulder musculature.    Stability/Clinical Decision Making  Evolving/Moderate complexity    Clinical Decision Making  Moderate    Rehab Potential  Fair    PT Frequency  2x / week    PT Duration  4 weeks    PT Treatment/Interventions  ADLs/Self Care Home Management;Cryotherapy;Electrical Stimulation;Iontophoresis 4mg /ml Dexamethasone;Moist Heat;Therapeutic exercise;Therapeutic activities;Functional mobility training;Neuromuscular re-education;Patient/family education;Passive range of motion;Manual techniques;Dry needling;Vestibular    PT Next Visit Plan  Manual tx./ Dry needling to cervical paraspinals/ UT trigger points.; progress shoulder endurance exercises    PT Home Exercise Plan  see Handouts.  Pt. given prone T's and manual STM with tennis balls.       Patient will benefit from skilled therapeutic intervention in order to improve the following deficits and impairments:  Improper body mechanics, Pain, Postural dysfunction, Decreased mobility, Decreased activity tolerance, Decreased range of motion, Hypomobility  Visit Diagnosis: Painful cervical range of motion  Neck pain  Joint stiffness  Abnormal posture     Problem List There are no active problems to display for this patient.  Cammie McgeeMichael C Sherk, PT, DPT # [id number] Temple PaciniHaley Kameko Hukill, SPT 10/06/2018, 4:50 PM  [facility] [address] [place], [place], [address] Phone: [phone]   Fax:  [phone]  Name: Shauna HughDavid W Cowens MRN: [id number] Date of Birth: Jul 09, 1960

## 2018-10-08 ENCOUNTER — Ambulatory Visit: Payer: Managed Care, Other (non HMO) | Admitting: Physical Therapy

## 2018-10-08 ENCOUNTER — Other Ambulatory Visit: Payer: Self-pay

## 2018-10-08 ENCOUNTER — Encounter: Payer: Self-pay | Admitting: Physical Therapy

## 2018-10-08 DIAGNOSIS — M542 Cervicalgia: Secondary | ICD-10-CM | POA: Diagnosis not present

## 2018-10-08 DIAGNOSIS — M2560 Stiffness of unspecified joint, not elsewhere classified: Secondary | ICD-10-CM

## 2018-10-08 DIAGNOSIS — R293 Abnormal posture: Secondary | ICD-10-CM

## 2018-10-08 NOTE — Therapy (Signed)
[place] [facility] [address] [place], [place], [address] Phone: [phone]   Fax:  [phone]  Physical Therapy Treatment  Patient Details  Name: Steven Nielsen MRN: [id number] Date of Birth: 06/24/1960 Referring Provider (PT): Dr. Alverda Skeans   Encounter Date: 10/08/2018  PT End of Session - 10/08/18 1522    Visit Number  6    Number of Visits  8    Date for PT Re-Evaluation  10/15/18    PT Start Time  0228    PT Stop Time  0316    PT Time Calculation (min)  48 min    Activity Tolerance  Patient tolerated treatment well    Behavior During Therapy  [facility] for tasks assessed/performed       History reviewed. No pertinent past medical history.  History reviewed. No pertinent surgical history.  There were no vitals filed for this visit.  Subjective Assessment - 10/08/18 1521    Subjective  Pt reports no neck pain today, but does report some low back discomfort. Pt reports he "tweaked" his back performing yard work yesterday. Pt reports performing HEP.    Pertinent History  see medical chart.  Pt. known to PT    Limitations  Walking;Standing;House hold activities    Patient Stated Goals  Decrease neck discomfort/ pain with daily tasks.    Currently in Pain?  No/denies    Pain Onset  More than a month ago    Pain Onset  1 to 4 weeks ago       Manual:   Pt prone, unilateral Pas, grade II-III C3-T7 x30 sec each Pt prone STM to B upper traps/levator scap., scapular musculature x several minutes  Therapeutic Exercise:  Prone press up 2x with 30 sec hold Supine pec stretch with and without 1# dumbbell - 3x30 sec  Seated trap, levator scap stretch 2x30 sec each B with overpressure Standing Nautilus rows 40# - 2x20. Cuing provided for technique. Seated Nautilus pull-downs 50#. Cuing provided for technique. Standing Nautilus shoulder with 20# - 2x15 each. Cuing provided for technique/pt compensates with shoulder abductors B.        PT Long Term Goals - 09/29/18 1310      PT LONG TERM GOAL #1   Title  Pt. will complete FOTO and score set goal to improve pain-free mobility.    Baseline  FOTO    Time  4    Period  Weeks    Status  New    Target Date  10/15/18      PT LONG TERM GOAL #2   Title  Pt. will increase his cervical rotation ROM to >55 degrees in order to improve function and increase ability to participate in desired activities.    Baseline  Cervical flexion (44 deg.), ext. (45 deg.), L rotn. (46 deg.), R rotn. (48 deg.), L lat. flex. (40 deg.), R lat. flex. (40 deg.).  Several UT trigger point noted    Time  4    Period  Weeks    Status  New    Target Date  10/15/18      PT LONG TERM GOAL #3   Title  Pt. will report no neck pain with AROM (all planes) to improve daily mobility.    Baseline  Increase cervical pain with prolonged activity/ household tasks.    Time  4    Period  Weeks    Status  New    Target Date  10/15/18  PT LONG TERM GOAL #4   Title  Pt. will demonstrate proper upright seated/standing posture to improve pain-free mobility.    Baseline  Forward head/ rounded shoulder posture    Time  4    Period  Weeks    Status  New    Target Date  10/15/18        Plan - 10/08/18 1523    Clinical Impression Statement  Pt demonstrates progress in therapy today with reports of no neck pain at rest today. However, pt TTP on R with prone unilateral PAs from C7-T3. Pt also TTP in R upper trap muscle, but reported sx improved with STM. Pt continues to require minor cuing for improved technique/decreased B upper trap activation with Nautilus pull-downs and rows. Pt also required cuing today to perform Nautilus shoulder ER and compensated with shoulder abductors, indicating decreased strength of B shoulder ERs. Pt will continue to benefit from further skilled therapy to improve cervical sx for improved tolerance for activities.    Stability/Clinical Decision Making  Evolving/Moderate  complexity    Clinical Decision Making  Moderate    Rehab Potential  Fair    PT Frequency  2x / week    PT Duration  4 weeks    PT Treatment/Interventions  ADLs/Self Care Home Management;Cryotherapy;Electrical Stimulation;Iontophoresis 4mg /ml Dexamethasone;Moist Heat;Therapeutic exercise;Therapeutic activities;Functional mobility training;Neuromuscular re-education;Patient/family education;Passive range of motion;Manual techniques;Dry needling;Vestibular    PT Next Visit Plan  Manual tx./ Dry needling to cervical paraspinals/ UT trigger points.; progress shoulder endurance exercises; STM and mobilizations to thoracic and cervical regions    PT Home Exercise Plan  see Handouts.  Pt. given prone T's and manual STM with tennis balls.    Consulted and Agree with Plan of Care  Patient       Patient will benefit from skilled therapeutic intervention in order to improve the following deficits and impairments:  Improper body mechanics, Pain, Postural dysfunction, Decreased mobility, Decreased activity tolerance, Decreased range of motion, Hypomobility  Visit Diagnosis: Painful cervical range of motion  Neck pain  Joint stiffness  Abnormal posture     Problem List There are no active problems to display for this patient.  Steven Nielsen, PT, DPT # [id number] Temple PaciniHaley Serrena Nielsen, SPT 10/09/2018, 12:52 PM  [place] [facility] [address] [place], [place], [address] Phone: [phone]   Fax:  [phone]  Name: Steven Nielsen MRN: [id number] Date of Birth: 1960/12/31

## 2018-10-13 ENCOUNTER — Encounter: Payer: Self-pay | Admitting: Physical Therapy

## 2018-10-13 ENCOUNTER — Other Ambulatory Visit: Payer: Self-pay

## 2018-10-13 ENCOUNTER — Ambulatory Visit: Payer: Managed Care, Other (non HMO) | Admitting: Physical Therapy

## 2018-10-13 DIAGNOSIS — M542 Cervicalgia: Secondary | ICD-10-CM | POA: Diagnosis not present

## 2018-10-13 DIAGNOSIS — R293 Abnormal posture: Secondary | ICD-10-CM

## 2018-10-13 DIAGNOSIS — M2560 Stiffness of unspecified joint, not elsewhere classified: Secondary | ICD-10-CM

## 2018-10-13 NOTE — Therapy (Signed)
[place] [facility] [address] [place], [place], [address] Phone: [phone]   Fax:  [phone]  Physical Therapy Treatment  Patient Details  Name: Steven Nielsen MRN: [id number] Date of Birth: 04/06/1960 Referring Provider (PT): Dr. Alverda Skeans   Encounter Date: 10/13/2018  PT End of Session - 10/13/18 1322    Visit Number  7    Number of Visits  8    Date for PT Re-Evaluation  10/15/18    PT Start Time  1226    PT Stop Time  1316    PT Time Calculation (min)  50 min    Equipment Utilized During Treatment  --    Activity Tolerance  Patient tolerated treatment well    Behavior During Therapy  [facility] for tasks assessed/performed       History reviewed. No pertinent past medical history.  History reviewed. No pertinent surgical history.  There were no vitals filed for this visit.  Subjective Assessment - 10/13/18 1320    Subjective  Pt reports 2-3/10 B neck pain today. Pt reports he thinks increased pain today is due to extensive yard work he did yesterday.    Pertinent History  see medical chart.  Pt. known to PT    Limitations  Walking;Standing;House hold activities    Patient Stated Goals  Decrease neck discomfort/ pain with daily tasks.    Currently in Pain?  Yes    Pain Score  3     Pain Location  Neck    Pain Orientation  Right;Left    Pain Onset  More than a month ago    Pain Onset  1 to 4 weeks ago       Heating pad placed over B shoulders/over B upper traps and scapular musculature with pt in prone, 10 min. Pt with no adverse reactions noted.  Manual:  Pt prone, STM to B cervical paraspinals, B upper traps, B scapular musculature, and B thoracic paraspinals - x 12 minutes  Pt prone, central PAs - C7-T5 4x30 sec each  Pt supine pec stretch 2x60 sec  Therapeutic exercise:  Nautilus pull-downs 40# - 2x20. Min cuing for technique Nautilus tricpe extension 30# - 2x20 Nautilus horizontal adduction 40#- 2x20. Min  cuing for technique Bent over rows with 10# dumbbell - 1x20 B. Cuing provided for technique/hip hinge Standing push-ups against wall - 2x20 Standing lateral raises with 4# dumbbell - 1x20. Cuing for technique.       PT Education - 10/13/18 1321    Education Details  Pt educated on technique with bent over row with dumbbells    Person(s) Educated  Patient    Methods  Explanation;Demonstration    Comprehension  Verbalized understanding;Returned demonstration          PT Long Term Goals - 09/29/18 1310      PT LONG TERM GOAL #1   Title  Pt. will complete FOTO and score set goal to improve pain-free mobility.    Baseline  FOTO    Time  4    Period  Weeks    Status  New    Target Date  10/15/18      PT LONG TERM GOAL #2   Title  Pt. will increase his cervical rotation ROM to >55 degrees in order to improve function and increase ability to participate in desired activities.    Baseline  Cervical flexion (44 deg.), ext. (45 deg.), L rotn. (46 deg.), R rotn. (48 deg.), L  lat. flex. (40 deg.), R lat. flex. (40 deg.).  Several UT trigger point noted    Time  4    Period  Weeks    Status  New    Target Date  10/15/18      PT LONG TERM GOAL #3   Title  Pt. will report no neck pain with AROM (all planes) to improve daily mobility.    Baseline  Increase cervical pain with prolonged activity/ household tasks.    Time  4    Period  Weeks    Status  New    Target Date  10/15/18      PT LONG TERM GOAL #4   Title  Pt. will demonstrate proper upright seated/standing posture to improve pain-free mobility.    Baseline  Forward head/ rounded shoulder posture    Time  4    Period  Weeks    Status  New    Target Date  10/15/18            Plan - 10/13/18 1322    Clinical Impression Statement  Steven Nielsen focused on manual therapy this session as pt reports increase in neck pain B as a result of extensive yardwork yesterday. Pt also with trigger points in B upper traps, with more noted in  R>L side today. Pt demonstrates slight improvement with increased tolerance for prone central PAs to C7-T5 and reported improvement in cervical sx after. Pt also with reports of increased "tigthness" with R upper trap/levator scap stretch compared to left side. Pt reported no increased pain with strengthening exercises. Pt will continue to benefit from further skilled therapy to reduce neck pain.    Stability/Clinical Decision Making  Evolving/Moderate complexity    Clinical Decision Making  Moderate    Rehab Potential  Fair    PT Frequency  2x / week    PT Duration  4 weeks    PT Treatment/Interventions  ADLs/Self Care Home Management;Cryotherapy;Electrical Stimulation;Iontophoresis 4mg /ml Dexamethasone;Moist Heat;Therapeutic exercise;Therapeutic activities;Functional mobility training;Neuromuscular re-education;Patient/family education;Passive range of motion;Manual techniques;Dry needling;Vestibular    PT Next Visit Plan  Reasses goals    PT Home Exercise Plan  see Handouts.  Pt. given prone T's and manual STM with tennis balls.    Consulted and Agree with Plan of Care  Patient       Patient will benefit from skilled therapeutic intervention in order to improve the following deficits and impairments:  Improper body mechanics, Pain, Postural dysfunction, Decreased mobility, Decreased activity tolerance, Decreased range of motion, Hypomobility  Visit Diagnosis: Painful cervical range of motion  Neck pain  Joint stiffness  Abnormal posture     Problem List There are no active problems to display for this patient.  Steven Nielsen, PT, DPT # [id number] Temple PaciniHaley Tishie Nielsen, Steven Nielsen 10/13/2018, 1:33 PM  [facility] [address] [place], [place], [address] Phone: [phone]   Fax:  [phone]  Name: Steven Nielsen MRN: [id number] Date of Birth: Jul 06, 1960

## 2018-10-15 ENCOUNTER — Encounter: Payer: Managed Care, Other (non HMO) | Admitting: Physical Therapy

## 2018-10-17 ENCOUNTER — Ambulatory Visit: Payer: Managed Care, Other (non HMO) | Admitting: Physical Therapy

## 2018-10-22 ENCOUNTER — Ambulatory Visit: Payer: Managed Care, Other (non HMO) | Attending: Neurology | Admitting: Physical Therapy

## 2018-10-22 ENCOUNTER — Other Ambulatory Visit: Payer: Self-pay

## 2018-10-22 ENCOUNTER — Encounter: Payer: Self-pay | Admitting: Physical Therapy

## 2018-10-22 DIAGNOSIS — M2560 Stiffness of unspecified joint, not elsewhere classified: Secondary | ICD-10-CM | POA: Diagnosis present

## 2018-10-22 DIAGNOSIS — R293 Abnormal posture: Secondary | ICD-10-CM | POA: Diagnosis present

## 2018-10-22 DIAGNOSIS — M542 Cervicalgia: Secondary | ICD-10-CM | POA: Diagnosis present

## 2018-10-22 NOTE — Therapy (Signed)
[place] [facility] [address] [place], [place], [address] Phone: [phone]   Fax:  [phone]  Physical Therapy Treatment  Patient Details  Name: Steven Nielsen MRN: [id number] Date of Birth: 10/14/1960 Referring Provider (PT): Dr. Alverda Skeans   Encounter Date: 10/22/2018  PT End of Session - 10/22/18 1520    Visit Number  8    Number of Visits  12    Date for PT Re-Evaluation  11/19/18    PT Start Time  3903    PT Stop Time  1506    PT Time Calculation (min)  49 min    Activity Tolerance  Patient tolerated treatment well    Behavior During Therapy  [facility] for tasks assessed/performed       History reviewed. No pertinent past medical history.  History reviewed. No pertinent surgical history.  There were no vitals filed for this visit.   Subjective Assessment - 10/22/18 1519    Subjective  Pt reports 0/10 pain today. Pt report one migraine since previous session and is reason why he missed previous session.    Pertinent History  see medical chart.  Pt. known to PT    Limitations  Walking;Standing;House hold activities    Patient Stated Goals  Decrease neck discomfort/ pain with daily tasks.    Currently in Pain?  No/denies    Pain Onset  More than a month ago    Pain Onset  1 to 4 weeks ago       Cervical rotation 65 to L and 70 to R [facility] PT Assessment - 10/23/18 0001      Assessment   Medical Diagnosis  Chronic neck pain    Referring Provider (PT)  Dr. Alverda Skeans    Onset Date/Surgical Date  02/12/18    Prior Therapy  yes, pt. known well to PT      Prior Function   Level of Independence  Independent      Cognition   Overall Cognitive Status  Within Functional Limits for tasks assessed        FOTO: 76 (age norm 40)   Therapeutic exercise: Scapular squeezes 2x10 - Cuing to reduce upper trap activation Chin tucks with stability ball against wall - 10x with 3 second isometric. cuing for technique Upper  trap stretch 2x30 sec Stability ball rollouts-  20x Nautilus Ws - 30# - 2x20 BTB rows - 2x20 BTB shoulder extension, 30# - 2x2-  AROM: painful lateral flexion to L, rotation to L, and pain on L with extension all in upper trap  Manual: STM to B upper traps, cervical extensors, scapular musculature - x several minutes PROM cervical flexion, lateral flexion B, rotation B - 5x each Upper trap stretch B - 2x30 sec Cervical extensor stretch - 2x30 sec   PT Long Term Goals - 10/22/18 1525      PT LONG TERM GOAL #1   Title  Pt. will complete FOTO and score set goal to improve pain-free mobility.    Baseline  FOTO; FOTO 49 10/22/2018    Time  4    Period  Weeks    Status  Achieved    Target Date  10/22/18      PT LONG TERM GOAL #2   Title  Pt. will increase his cervical rotation ROM to >55 degrees in order to improve function and increase ability to participate in desired activities.    Baseline  Cervical flexion (44 deg.), ext. (45 deg.),  L rotn. (46 deg.), R rotn. (48 deg.), L lat. flex. (40 deg.), R lat. flex. (40 deg.).  Several UT trigger point noted; Cervical rotation 65 to L and 70 to R, pain to L 10/22/2018    Time  4    Period  Weeks    Status  Achieved    Target Date  10/22/18      PT LONG TERM GOAL #3   Title  Pt. will report no neck pain with AROM (all planes) to improve daily mobility.    Baseline  Increase cervical pain with prolonged activity/ household tasks;  painful lateral flexion to L, rotation to L, and pain on L with extension all in upper trap 10/22/2018    Time  4    Period  Weeks    Status  Partially Met    Target Date  11/19/18      PT LONG TERM GOAL #4   Title  Pt. will demonstrate proper upright seated/standing posture to improve pain-free mobility.    Baseline  Forward head/ rounded shoulder posture; forward head posture/rounded shoulders with standing and sitting 10/22/2018    Time  4    Period  Weeks    Status  Partially Met    Target Date  11/19/18       PT LONG TERM GOAL #5   Title  Pt FOTO score will be 75 to demonstrate an increase in functional mobility    Baseline  Pt FOTO score 38 (age norm 51) 10/22/2018    Time  4    Period  Weeks    Status  New    Target Date  11/19/18            Plan - 10/22/18 1515    Clinical Impression Statement  Pt demonstrates progress with increased cervical rotation: 65 deg L and 70 deg. R.  However, pt with forward head posture with sitting and standing. Pt also with pain of L upper trap/cervical paraspinals with L rotation, L lateral flexion, and extension. Pt FOTO score 26 today (age norm 88) indicating decreased functional mobility. Pt will continue to benefit from further skilled therapy to increase pain-free cervical ROM to improve QOL.    Stability/Clinical Decision Making  Evolving/Moderate complexity    Clinical Decision Making  Moderate    Rehab Potential  Fair    PT Frequency  2x / week    PT Duration  4 weeks    PT Treatment/Interventions  ADLs/Self Care Home Management;Cryotherapy;Electrical Stimulation;Iontophoresis 44m/ml Dexamethasone;Moist Heat;Therapeutic exercise;Therapeutic activities;Functional mobility training;Neuromuscular re-education;Patient/family education;Passive range of motion;Manual techniques;Dry needling;Vestibular    PT Next Visit Plan  Cervical AROM to tolerance (pain-free range) and progress shoulder strengthening exercises    PT Home Exercise Plan  see Handouts.  Pt. given prone T's and manual STM with tennis balls.    Consulted and Agree with Plan of Care  Patient       Patient will benefit from skilled therapeutic intervention in order to improve the following deficits and impairments:  Improper body mechanics, Pain, Postural dysfunction, Decreased mobility, Decreased activity tolerance, Decreased range of motion, Hypomobility  Visit Diagnosis: Painful cervical range of motion  Neck pain     Problem List There are no active problems to display for this  patient.  MPura Spice PT, DPT # [id number] Dillon SPT 10/23/2018, 2:54 PM  [facility] [address]: [phone]  Fax:  [phone]  Name: Steven Nielsen MRN: [id number] Date of Birth: [birth year]

## 2018-10-22 NOTE — Patient Instructions (Signed)
Access Code: [email or web address]  URL: [email or web address]  Date: 10/22/2018  Prepared by: Dorcas Carrow   Exercises  Seated Cervical Retraction - 10 reps - 3 sets - 1x daily - 4x weekly  Shoulder External Rotation and Scapular Retraction with Resistance - 20 reps - 2 sets - 1x daily - 4x weekly  Standing Shoulder Row with Anchored Resistance - 20 reps - 2 sets - 1x daily - 4x weekly  Single Arm Shoulder Extension with Anchored Resistance - 20 reps - 2 sets - 1x daily - 4x weekly

## 2018-10-27 ENCOUNTER — Encounter: Payer: Self-pay | Admitting: Physical Therapy

## 2018-10-27 ENCOUNTER — Other Ambulatory Visit: Payer: Self-pay

## 2018-10-27 ENCOUNTER — Ambulatory Visit: Payer: Managed Care, Other (non HMO) | Admitting: Physical Therapy

## 2018-10-27 DIAGNOSIS — M542 Cervicalgia: Secondary | ICD-10-CM

## 2018-10-27 DIAGNOSIS — R293 Abnormal posture: Secondary | ICD-10-CM

## 2018-10-27 DIAGNOSIS — M2560 Stiffness of unspecified joint, not elsewhere classified: Secondary | ICD-10-CM

## 2018-10-27 NOTE — Therapy (Signed)
[facility] [address] [place], [place], [address] Phone: [phone]   Fax:  [phone]  Physical Therapy Treatment  Patient Details  Name: Steven Nielsen MRN: [id number] Date of Birth: 1960/08/22 Referring Provider (PT): Dr. Alverda Skeans   Encounter Date: 10/27/2018  PT End of Session - 10/27/18 1544    Visit Number  9    Number of Visits  12    Date for PT Re-Evaluation  11/19/18    PT Start Time  3704    PT Stop Time  1506    PT Time Calculation (min)  49 min    Activity Tolerance  Patient tolerated treatment well    Behavior During Therapy  [facility] for tasks assessed/performed       History reviewed. No pertinent past medical history.  History reviewed. No pertinent surgical history.  There were no vitals filed for this visit.  Subjective Assessment - 10/27/18 1542    Subjective  Pt reports no pain today. 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 Pt reports performing HEP.    Pertinent History  see medical chart.  Pt. known to PT    Limitations  Walking;Standing;House hold activities    Patient Stated Goals  Decrease neck discomfort/ pain with daily tasks.    Currently in Pain?  No/denies    Pain Onset  More than a month ago    Pain Onset  1 to 4 weeks ago       Manual:  Pt prone STM to B thoracic and cervical paraspinals, B scapular musculature, B upper traps - x several minutes. Pt TTP along B cervical and upper thoracic paraspinals.  Pt prone CPAs and UPAs on L grade II-III, C4-T6 2x30 sec each. Pt TTP C4-T3 with both CPAs and UPAs on L  Therapeutic exercise:  Seated upper trap, levator scap stretching with overpressure - 2x30 sec each. Cervical rotation stretch - 2x30 sec B  Cervical flexion, extension stretch - 2x30 sec Nautilus pulldowns 40#, rows 50#, 2x15 each Standing BTB ER - 2x20. Cuing for technique Standing chin tucks  against ball - 10x with 3 second isometrics  Standing chin tucks against ball with BTB ER - 10x with 3 second isometrics     PT Education - 10/27/18 1544    Education provided  Yes    Education Details  Pt educated on exercise technique/wall walk with BTB    Person(s) Educated  Patient    Methods  Explanation;Demonstration    Comprehension  Verbalized understanding;Returned demonstration          PT Long Term Goals - 10/22/18 1525      PT LONG TERM GOAL #1   Title  Pt. will complete FOTO and score set goal to improve pain-free mobility.    Baseline  FOTO; FOTO 49 10/22/2018    Time  4    Period  Weeks    Status  Achieved    Target Date  10/22/18      PT LONG TERM GOAL #2   Title  Pt. will increase his cervical rotation ROM to >55 degrees in order to improve function and increase ability to participate in desired activities.    Baseline  Cervical flexion (44 deg.), ext. (45 deg.), L rotn. (46 deg.), R rotn. (48 deg.), L lat. flex. (40 deg.), R lat. flex. (40 deg.).  Several UT  trigger point noted; Cervical rotation 65 to L and 70 to R, pain to L 10/22/2018    Time  4    Period  Weeks    Status  Achieved    Target Date  10/22/18      PT LONG TERM GOAL #3   Title  Pt. will report no neck pain with AROM (all planes) to improve daily mobility.    Baseline  Increase cervical pain with prolonged activity/ household tasks;  painful lateral flexion to L, rotation to L, and pain on L with extension all in upper trap 10/22/2018    Time  4    Period  Weeks    Status  Partially Met    Target Date  11/19/18      PT LONG TERM GOAL #4   Title  Pt. will demonstrate proper upright seated/standing posture to improve pain-free mobility.    Baseline  Forward head/ rounded shoulder posture; forward head posture/rounded shoulders with standing and sitting 10/22/2018    Time  4    Period  Weeks    Status  Partially Met    Target Date  11/19/18      PT LONG TERM GOAL #5   Title  Pt FOTO score  will be 75 to demonstrate an increase in functional mobility    Baseline  Pt FOTO score 50 (age norm 80) 10/22/2018    Time  4    Period  Weeks    Status  New    Target Date  11/19/18            Plan - 10/28/18 0950    Clinical Impression Statement  Pt reports improvement with neck "stiffness" after therapeutic exercise and manual therapy at end of treatment. Pt had pain over C4-6 with L cervical rotation and L lateral flexion that resolved out of position. Pt TTP along B cervical and upper thoracic paraspinals, and TPP with prone CPAs and L UPAs from C4-T3. Pt required cuing for technique with BTB ER, compensating with B shoulder abduction. PT also required cuing for standing chin tucks with BTB ER, indicating decreased coordination of scapular and cervical musculature. Pt will benefit from further skilled therapy to improve pain-free cervical ROM in order to improve ease with work activities and ADLs.    Stability/Clinical Decision Making  Evolving/Moderate complexity    Clinical Decision Making  Moderate    Rehab Potential  Fair    PT Frequency  2x / week    PT Duration  4 weeks    PT Treatment/Interventions  ADLs/Self Care Home Management;Cryotherapy;Electrical Stimulation;Iontophoresis 64m/ml Dexamethasone;Moist Heat;Therapeutic exercise;Therapeutic activities;Functional mobility training;Neuromuscular re-education;Patient/family education;Passive range of motion;Manual techniques;Dry needling;Vestibular    PT Next Visit Plan  stretching of cervical musculature, continue increase resistance as tolerated with shoulder strengthening exercises    PT Home Exercise Plan  see Handouts.  Pt. given prone T's and manual STM with tennis balls.    Consulted and Agree with Plan of Care  Patient       Patient will benefit from skilled therapeutic intervention in order to improve the following deficits and impairments:  Improper body mechanics, Pain, Postural dysfunction, Decreased mobility,  Decreased activity tolerance, Decreased range of motion, Hypomobility  Visit Diagnosis: Painful cervical range of motion  Neck pain  Joint stiffness  Abnormal posture     Problem List There are no active problems to display for this patient.  MPura Spice PT, DPT # [id number] Dillon SPT 10/28/2018, 11:57 AM  [facility] [address] [place], [place], [address] Phone: [phone]   Fax:  [phone]  Name: Steven Nielsen MRN: [id number] Date of Birth: [birth year]

## 2018-10-29 ENCOUNTER — Other Ambulatory Visit: Payer: Self-pay

## 2018-10-29 ENCOUNTER — Ambulatory Visit: Payer: Managed Care, Other (non HMO) | Admitting: Physical Therapy

## 2018-10-29 ENCOUNTER — Encounter: Payer: Self-pay | Admitting: Physical Therapy

## 2018-10-29 DIAGNOSIS — R293 Abnormal posture: Secondary | ICD-10-CM

## 2018-10-29 DIAGNOSIS — M542 Cervicalgia: Secondary | ICD-10-CM | POA: Diagnosis not present

## 2018-10-29 DIAGNOSIS — M2560 Stiffness of unspecified joint, not elsewhere classified: Secondary | ICD-10-CM

## 2018-10-29 NOTE — Therapy (Signed)
[facility] [address] [place], [place], [address] Phone: [phone]   Fax:  [phone]  Physical Therapy Treatment  Patient Details  Name: Steven Nielsen MRN: [id number] Date of Birth: [birth year] Referring Provider (PT): Dr. Alverda Skeans   Encounter Date: 10/29/2018  PT End of Session - 10/29/18 1529    Visit Number  10    Number of Visits  12    Date for PT Re-Evaluation  11/19/18    PT Start Time  1406    PT Stop Time  1501    PT Time Calculation (min)  55 min    Activity Tolerance  Patient tolerated treatment well;Patient limited by pain    Behavior During Therapy  [facility] for tasks assessed/performed       History reviewed. No pertinent past medical history.  History reviewed. No pertinent surgical history.  There were no vitals filed for this visit.  Subjective Assessment - 10/29/18 1527    Subjective  Pt reports onset of headache today and that he took migraine medication. Pt reports headache pain is 5/10.    Pertinent History  see medical chart.  Pt. known to PT    Limitations  Walking;Standing;House hold activities    Patient Stated Goals  Decrease neck discomfort/ pain with daily tasks.    Currently in Pain?  Yes    Pain Score  5     Pain Location  Head    Pain Onset  More than a month ago    Pain Onset  1 to 4 weeks ago       Manual: Pt supine STM to B upper trap (to occipital insertion for occipital release) and B cervical paraspinals - x several minutes Supine assisted upper trap, levator stretching - 6x30 sec each Supine assisted cervical extensor stretch - 4x30 sec   Therapeutic exercise: Supine pec stretch with 1# dumbbell in 90 and 140 deg. Shoulder abduction - 4x30 sec each  Supine half-foam supported upper-thoracic extension stretch - 4x30 sec Supine open book R and L side - 10x each with 5 sec hold at end range Seated upper trap and levator scap stretch - 2x30 sec B Seated cervical flexor  stretch - 2x30 sec Seated thoracic/cervical extension AROM - 20x with 3 sec holds at end range   PT Education - 10/29/18 1528    Education Details  Pt educated on cervical stretching and use of heat for pain modulation    Person(s) Educated  Patient    Methods  Explanation;Demonstration    Comprehension  Returned demonstration;Verbalized understanding          PT Long Term Goals - 10/22/18 1525      PT LONG TERM GOAL #1   Title  Pt. will complete FOTO and score set goal to improve pain-free mobility.    Baseline  FOTO; FOTO 49 10/22/2018    Time  4    Period  Weeks    Status  Achieved    Target Date  10/22/18      PT LONG TERM GOAL #2   Title  Pt. will increase his cervical rotation ROM to >55 degrees in order to improve function and increase ability to participate in desired activities.    Baseline  Cervical flexion (44 deg.), ext. (45 deg.), L rotn. (46 deg.), R rotn. (48 deg.), L lat. flex. (40 deg.), R lat. flex. (40 deg.).  Several UT trigger point noted; Cervical rotation 65 to L and 70  to R, pain to L 10/22/2018    Time  4    Period  Weeks    Status  Achieved    Target Date  10/22/18      PT LONG TERM GOAL #3   Title  Pt. will report no neck pain with AROM (all planes) to improve daily mobility.    Baseline  Increase cervical pain with prolonged activity/ household tasks;  painful lateral flexion to L, rotation to L, and pain on L with extension all in upper trap 10/22/2018    Time  4    Period  Weeks    Status  Partially Met    Target Date  11/19/18      PT LONG TERM GOAL #4   Title  Pt. will demonstrate proper upright seated/standing posture to improve pain-free mobility.    Baseline  Forward head/ rounded shoulder posture; forward head posture/rounded shoulders with standing and sitting 10/22/2018    Time  4    Period  Weeks    Status  Partially Met    Target Date  11/19/18      PT LONG TERM GOAL #5   Title  Pt FOTO score will be 75 to demonstrate an increase in  functional mobility    Baseline  Pt FOTO score 89 (age norm 23) 10/22/2018    Time  4    Period  Weeks    Status  New    Target Date  11/19/18          Plan - 10/29/18 1529    Clinical Impression Statement  Pt limited with therapeutic exercise today secondary to increased migraine sx. PT session with manual therapy focus to reduce pain sx (STM to B upper trap to occipital insertion and B cervical paraspinals). Pt TTP predominantly over R upper trap insertion at occiput, but still TTP througout B upper trap. Pt reported decreased headache pain after manual therapy. Pt also reported "tightness" with supine pec stretch with emphasis on upper thoracic extension. Pt will continue to benefit from further skilled therapy to improve cervical pain and stiffness to increase pain-free cervical ROM.    Stability/Clinical Decision Making  Evolving/Moderate complexity    Clinical Decision Making  Moderate    Rehab Potential  Fair    PT Frequency  2x / week    PT Duration  4 weeks    PT Treatment/Interventions  ADLs/Self Care Home Management;Cryotherapy;Electrical Stimulation;Iontophoresis 42m/ml Dexamethasone;Moist Heat;Therapeutic exercise;Therapeutic activities;Functional mobility training;Neuromuscular re-education;Patient/family education;Passive range of motion;Manual techniques;Dry needling;Vestibular    PT Next Visit Plan  stretching of cervical musculature, continue increase resistance as tolerated with shoulder strengthening exercises; cervical and upper-thoracic AROM exercises    PT Home Exercise Plan  see Handouts.  Pt. given prone T's and manual STM with tennis balls.    Consulted and Agree with Plan of Care  Patient       Patient will benefit from skilled therapeutic intervention in order to improve the following deficits and impairments:  Improper body mechanics, Pain, Postural dysfunction, Decreased mobility, Decreased activity tolerance, Decreased range of motion, Hypomobility  Visit  Diagnosis: Painful cervical range of motion  Neck pain  Joint stiffness  Abnormal posture     Problem List There are no active problems to display for this patient.  MPura Spice PT, DPT # [id number] Dillon SPT 10/29/2018, 3:44 PM  [facility] [address]Phone: [phone]  Fax:  [phone]  Name: Steven Nielsen MRN: [id number] Date of Birth: 12/17/1960

## 2018-11-05 ENCOUNTER — Encounter: Payer: Self-pay | Admitting: Physical Therapy

## 2018-11-05 ENCOUNTER — Other Ambulatory Visit: Payer: Self-pay

## 2018-11-05 ENCOUNTER — Ambulatory Visit: Payer: Managed Care, Other (non HMO) | Admitting: Physical Therapy

## 2018-11-05 DIAGNOSIS — M2560 Stiffness of unspecified joint, not elsewhere classified: Secondary | ICD-10-CM

## 2018-11-05 DIAGNOSIS — M542 Cervicalgia: Secondary | ICD-10-CM | POA: Diagnosis not present

## 2018-11-05 DIAGNOSIS — R293 Abnormal posture: Secondary | ICD-10-CM

## 2018-11-05 NOTE — Therapy (Signed)
[facility] [address] [place], [place], [address] Phone: [phone]   Fax:  [phone]  Physical Therapy Treatment  Patient Details  Name: Steven Nielsen MRN: [id number] Date of Birth: [birth year] Referring Provider (PT): Dr. Alverda Skeans   Encounter Date: 11/05/2018  PT End of Session - 11/06/18 1220    Visit Number  11    Number of Visits  12    Date for PT Re-Evaluation  11/19/18    PT Start Time  3790    PT Stop Time  1435    PT Time Calculation (min)  50 min    Activity Tolerance  Patient tolerated treatment well;Patient limited by pain    Behavior During Therapy  [facility] for tasks assessed/performed       History reviewed. No pertinent past medical history.  History reviewed. No pertinent surgical history.  There were no vitals filed for this visit.  Subjective Assessment - 11/05/18 1448    Subjective  Pt reports he has had migraines past few days, but no dizziness sx. Pt states "headaches feel different" compared to what he was experiencing before. Pt reports headaches are now primarily all in occipital region compared to before when he felt them "behind" his eyes. Pt reports headaches worse after getting up in morning and initially walking. Pt takes Tylenol first to treat HA sx before taking additional medication. Pt states heat and sleep improves HA sx. And that wearing yellow sunglasses has "kind of helped."    Pertinent History  see medical chart.  Pt. known to PT    Limitations  Walking;Standing;House hold activities    Patient Stated Goals  Decrease neck discomfort/ pain with daily tasks.    Currently in Pain?  Yes    Pain Location  Head    Pain Onset  More than a month ago    Pain Onset  1 to 4 weeks ago      Pt seated, hot pack applied to L upper trap/scapular musculature for 15 min while SPT performed STM to L upper trap/L cervical paraspinal musculature. Pt with no adverse reaction to tx.   Manual: Pt  supine STM to B upper trap (to occipital insertion for occipital release) and B cervical paraspinals - x several minutes Supine assisted upper trap, levator stretching, cervical extensor stretch - 6x30 sec each Seated STM to B upper trap, L parapspinal musculature with heat pack on L upper trap/ shoulder  Therapeutic Exercise: Seated upper trap and levator scap stretch - 4x30 sec B Seated cervical flexor stretch - 2x30 sec Seated chin tucks with manual resistance - x10 with 3 sec isometric hold Supine chin tucks into pillow - x10 with 3 sec isometric hold Supine pec stretch on vertical half-foam - 2x30 sec Supine open book R and L side - 10x each with 5 sec hold at end range Seated rows 40# - 2x20 cuing for decreasing upper trap activation, activating rhomboids with scapular squeeze      PT Education - 11/05/18 1452    Education Details  Pt educated on technique with L lateral flexion/repeated movement    Person(s) Educated  Patient    Methods  Explanation;Demonstration    Comprehension  Verbalized understanding;Returned demonstration          PT Long Term Goals - 10/22/18 1525      PT LONG TERM GOAL #1   Title  Pt. will complete FOTO and score set goal to improve pain-free mobility.  Baseline  FOTO; FOTO 49 10/22/2018    Time  4    Period  Weeks    Status  Achieved    Target Date  10/22/18      PT LONG TERM GOAL #2   Title  Pt. will increase his cervical rotation ROM to >55 degrees in order to improve function and increase ability to participate in desired activities.    Baseline  Cervical flexion (44 deg.), ext. (45 deg.), L rotn. (46 deg.), R rotn. (48 deg.), L lat. flex. (40 deg.), R lat. flex. (40 deg.).  Several UT trigger point noted; Cervical rotation 65 to L and 70 to R, pain to L 10/22/2018    Time  4    Period  Weeks    Status  Achieved    Target Date  10/22/18      PT LONG TERM GOAL #3   Title  Pt. will report no neck pain with AROM (all planes) to improve daily  mobility.    Baseline  Increase cervical pain with prolonged activity/ household tasks;  painful lateral flexion to L, rotation to L, and pain on L with extension all in upper trap 10/22/2018    Time  4    Period  Weeks    Status  Partially Met    Target Date  11/19/18      PT LONG TERM GOAL #4   Title  Pt. will demonstrate proper upright seated/standing posture to improve pain-free mobility.    Baseline  Forward head/ rounded shoulder posture; forward head posture/rounded shoulders with standing and sitting 10/22/2018    Time  4    Period  Weeks    Status  Partially Met    Target Date  11/19/18      PT LONG TERM GOAL #5   Title  Pt FOTO score will be 75 to demonstrate an increase in functional mobility    Baseline  Pt FOTO score 25 (age norm 6) 10/22/2018    Time  4    Period  Weeks    Status  New    Target Date  11/19/18            Plan - 11/06/18 1226    Clinical Impression Statement  Pt reports "stuck" feeling and increase in L side neck pain initially with L lateral flexion, but not L cervical rotation today. However, pt reported L cervical sx decreased with repeated L lateral flexion. Pt exhibited increased B upper trap activation with seated rows and required moderate verbal/tactile cues to decrease B upper trap activation and increase B rhomboid activation. Pt TTP along L paraspinals C3-C5 region. Pt will continue to benefit from further skilled therapy to increase pain-free cervical AROM in order to improve ease with ADLs.    Stability/Clinical Decision Making  Evolving/Moderate complexity    Clinical Decision Making  Moderate    Rehab Potential  Fair    PT Frequency  2x / week    PT Duration  4 weeks    PT Treatment/Interventions  ADLs/Self Care Home Management;Cryotherapy;Electrical Stimulation;Iontophoresis '4mg'$ /ml Dexamethasone;Moist Heat;Therapeutic exercise;Therapeutic activities;Functional mobility training;Neuromuscular re-education;Patient/family education;Passive  range of motion;Manual techniques;Dry needling;Vestibular    PT Next Visit Plan  Progress shoulder strengthening exercises within pain-free range    PT Home Exercise Plan  see Handouts.  Pt. given prone T's and manual STM with tennis balls.    Consulted and Agree with Plan of Care  Patient       Patient will benefit from  skilled therapeutic intervention in order to improve the following deficits and impairments:  Improper body mechanics, Pain, Postural dysfunction, Decreased mobility, Decreased activity tolerance, Decreased range of motion, Hypomobility  Visit Diagnosis: Painful cervical range of motion  Neck pain  Joint stiffness  Abnormal posture     Problem List There are no active problems to display for this patient.  Pura Spice, PT, DPT # [id number] Ricard Dillon, SPT 11/06/2018, 12:30 PM  [facility] [address] [place], [place], [address] Phone: [phone]   Fax:  [phone]  Name: Steven Nielsen MRN: [id number] Date of Birth: [birth year]

## 2018-11-12 ENCOUNTER — Encounter: Payer: Self-pay | Admitting: Physical Therapy

## 2018-11-12 ENCOUNTER — Ambulatory Visit: Payer: Managed Care, Other (non HMO) | Admitting: Physical Therapy

## 2018-11-12 ENCOUNTER — Other Ambulatory Visit: Payer: Self-pay

## 2018-11-12 DIAGNOSIS — M2560 Stiffness of unspecified joint, not elsewhere classified: Secondary | ICD-10-CM

## 2018-11-12 DIAGNOSIS — R293 Abnormal posture: Secondary | ICD-10-CM

## 2018-11-12 DIAGNOSIS — M542 Cervicalgia: Secondary | ICD-10-CM

## 2018-11-12 NOTE — Therapy (Signed)
[facility] [address] [place], [place], [address] Phone: [phone]   Fax:  [phone]  Physical Therapy Treatment  Patient Details  Name: Steven Nielsen MRN: [id number] Date of Birth: [birth year] Referring Provider (PT): Dr. Alverda Skeans   Encounter Date: 11/12/2018  PT End of Session - 11/12/18 1701    Visit Number  12    Number of Visits  12    Date for PT Re-Evaluation  11/19/18    PT Start Time  1446    PT Stop Time  1535    PT Time Calculation (min)  49 min    Activity Tolerance  Patient tolerated treatment well;Patient limited by pain    Behavior During Therapy  [facility] for tasks assessed/performed       History reviewed. No pertinent past medical history.  History reviewed. No pertinent surgical history.  There were no vitals filed for this visit.  Subjective Assessment - 11/12/18 1659    Subjective  Pt reports no pain today.  Pt reports having a migraine yesterday.  Pt reports no migraines today.    Pertinent History  see medical chart.  Pt. known to PT    Limitations  Walking;Standing;House hold activities    Patient Stated Goals  Decrease neck discomfort/ pain with daily tasks.    Currently in Pain?  No/denies       Manual Therapy: Supine cervical flexion/ lateral flexion stretching - 2x30sec hold each Supine occipital release - 4x30sec Supine STM to upper traps and cervical paraspinal musculature (14 min.) Prone UPAs to thoracic spine Prone STM to rhomboids, middle/lower traps, thoracic paraspinal musculature Supine B shoulder flexion/ horizontal abduction with OP 2x 30 sec.  Therapeutic Exercise: Prone scapular retraction - x20    PT Education - 11/12/18 1700    Education provided  Yes    Education Details  Pt educated on prone scapular retraction technique    Person(s) Educated  Patient    Methods  Explanation;Demonstration    Comprehension  Verbalized understanding;Returned demonstration           PT Long Term Goals - 10/22/18 1525      PT LONG TERM GOAL #1   Title  Pt. will complete FOTO and score set goal to improve pain-free mobility.    Baseline  FOTO; FOTO 49 10/22/2018    Time  4    Period  Weeks    Status  Achieved    Target Date  10/22/18      PT LONG TERM GOAL #2   Title  Pt. will increase his cervical rotation ROM to >55 degrees in order to improve function and increase ability to participate in desired activities.    Baseline  Cervical flexion (44 deg.), ext. (45 deg.), L rotn. (46 deg.), R rotn. (48 deg.), L lat. flex. (40 deg.), R lat. flex. (40 deg.).  Several UT trigger point noted; Cervical rotation 65 to L and 70 to R, pain to L 10/22/2018    Time  4    Period  Weeks    Status  Achieved    Target Date  10/22/18      PT LONG TERM GOAL #3   Title  Pt. will report no neck pain with AROM (all planes) to improve daily mobility.    Baseline  Increase cervical pain with prolonged activity/ household tasks;  painful lateral flexion to L, rotation to L, and pain on L with extension all in upper trap 10/22/2018  Time  4    Period  Weeks    Status  Partially Met    Target Date  11/19/18      PT LONG TERM GOAL #4   Title  Pt. will demonstrate proper upright seated/standing posture to improve pain-free mobility.    Baseline  Forward head/ rounded shoulder posture; forward head posture/rounded shoulders with standing and sitting 10/22/2018    Time  4    Period  Weeks    Status  Partially Met    Target Date  11/19/18      PT LONG TERM GOAL #5   Title  Pt FOTO score will be 75 to demonstrate an increase in functional mobility    Baseline  Pt FOTO score 13 (age norm 51) 10/22/2018    Time  4    Period  Weeks    Status  New    Target Date  11/19/18          Plan - 11/12/18 1702    Clinical Impression Statement  Pt has thoracic spine hypomobility. Pt responded well to cervical stretching (flexion/ lateral) and thoracic unilateral mobs with no pain. Pt reports  fewer bad headaches since starting therapy. Pt will benefit from further skilled therapy to increase pain-free cervical AROM in order to improve ease with ADLs.    Stability/Clinical Decision Making  Evolving/Moderate complexity    Clinical Decision Making  Moderate    Rehab Potential  Fair    PT Frequency  2x / week    PT Duration  4 weeks    PT Treatment/Interventions  ADLs/Self Care Home Management;Cryotherapy;Electrical Stimulation;Iontophoresis 68m/ml Dexamethasone;Moist Heat;Therapeutic exercise;Therapeutic activities;Functional mobility training;Neuromuscular re-education;Patient/family education;Passive range of motion;Manual techniques;Dry needling;Vestibular    PT Next Visit Plan  Progress shoulder strengthening exercises within pain-free range    PT Home Exercise Plan  see Handouts.  Pt. given prone T's and manual STM with tennis balls; recert/ assess for discharge    Consulted and Agree with Plan of Care  Patient       Patient will benefit from skilled therapeutic intervention in order to improve the following deficits and impairments:  Improper body mechanics, Pain, Postural dysfunction, Decreased mobility, Decreased activity tolerance, Decreased range of motion, Hypomobility  Visit Diagnosis: Painful cervical range of motion  Neck pain  Joint stiffness  Abnormal posture     Problem List There are no active problems to display for this patient.  MPura Spice PT, DPT # [id number] Greenland SPT 11/12/2018, 5:26 PM  [place] [facility]MDris[address] [place] [address]Phone: [phone]  Fax:  [phone] Name: Steven GOODPASTUREMRN: [id number] of Birth: [birth year]

## 2018-11-19 ENCOUNTER — Encounter: Payer: Self-pay | Admitting: Physical Therapy

## 2018-11-19 ENCOUNTER — Ambulatory Visit: Payer: Managed Care, Other (non HMO) | Attending: Neurology | Admitting: Physical Therapy

## 2018-11-19 ENCOUNTER — Other Ambulatory Visit: Payer: Self-pay

## 2018-11-19 DIAGNOSIS — M2560 Stiffness of unspecified joint, not elsewhere classified: Secondary | ICD-10-CM | POA: Insufficient documentation

## 2018-11-19 DIAGNOSIS — M542 Cervicalgia: Secondary | ICD-10-CM | POA: Insufficient documentation

## 2018-11-19 DIAGNOSIS — R293 Abnormal posture: Secondary | ICD-10-CM | POA: Insufficient documentation

## 2018-11-19 NOTE — Therapy (Signed)
[facility] [address] [place], [place], [address] Phone: [phone]   Fax:  [phone]  Physical Therapy Treatment  Patient Details  Name: Steven Nielsen MRN: [id number] Date of Birth: 05-08-1960 Referring Provider (PT): Dr. Alverda Skeans   Encounter Date: 11/19/2018    Encounter Date: 11/19/2018  PT End of Session - 11/19/18 1413    Visit Number  13    Number of Visits  17    Date for PT Re-Evaluation  12/17/18    PT Start Time  2683    PT Stop Time  1511    PT Time Calculation (min)  58 min    Activity Tolerance  Patient tolerated treatment well;Patient limited by pain    Behavior During Therapy  [facility] for tasks assessed/performed       History reviewed. No pertinent past medical history.  History reviewed. No pertinent surgical history.  There were no vitals filed for this visit.  Subjective Assessment - 11/19/18 1413    Subjective  Pt reports a headache since yesterday afternoon after driving about 5 hours.  Pt reports 4/10 pain in suboccipital cervical region.    Pertinent History  see medical chart.  Pt. known to PT    Limitations  Walking;Standing;House hold activities    Patient Stated Goals  Decrease neck discomfort/ pain with daily tasks.    Pain Score  4     Pain Location  Neck    Pain Orientation  Upper    Pain Descriptors / Indicators  Aching    Pain Type  Chronic pain    Pain Onset  Yesterday        Therapeutic Exercise: Seated cervical lateral flexion stretching  Nautilus scapular retraction 30# Nautilus shoulder extension 30#  Nautilus shoulder ER 10#   Manual Therapy: Prone STM to traps, rhomboids, thoracic/ cervical paraspinals Prone UPAs T1-T6 Supine STM to upper traps, cervical paraspinals, suboccipitals Supine suboccipital release - 3x30sec Supine flexion/ lateral stretching - 2x30 sec each    [facility] PT Assessment - 11/21/18 0001      Assessment   Medical Diagnosis  Chronic neck  pain    Referring Provider (PT)  Dr. Alverda Skeans    Onset Date/Surgical Date  02/12/18    Prior Therapy  yes, pt. known well to PT      Prior Function   Level of Independence  Independent      Cognition   Overall Cognitive Status  Within Functional Limits for tasks assessed       PT Long Term Goals - 11/21/18 0747      PT LONG TERM GOAL #1   Title  Pt. will complete FOTO and score set goal to improve pain-free mobility.    Baseline  FOTO; FOTO 49 10/22/2018    Time  4    Period  Weeks    Status  Achieved    Target Date  11/21/18      PT LONG TERM GOAL #2   Title  Pt. will increase his cervical rotation ROM to >55 degrees in order to improve function and increase ability to participate in desired activities.    Baseline  Cervical flexion (44 deg.), ext. (45 deg.), L rotn. (46 deg.), R rotn. (48 deg.), L lat. flex. (40 deg.), R lat. flex. (40 deg.).  Several UT trigger point noted; Cervical rotation 65 to L and 70 to R, pain to L 10/22/2018    Time  4  Period  Weeks    Status  Achieved    Target Date  11/21/18      PT LONG TERM GOAL #3   Title  Pt. will report no neck pain with AROM (all planes) to improve daily mobility.    Baseline  Increase cervical pain with prolonged activity/ household tasks;  painful lateral flexion to L, rotation to L, and pain on L with extension all in upper trap 10/22/2018    Time  4    Period  Weeks    Status  Partially Met    Target Date  12/17/18      PT LONG TERM GOAL #4   Title  Pt. will demonstrate proper upright seated/standing posture to improve pain-free mobility.    Baseline  Forward head/ rounded shoulder posture; forward head posture/rounded shoulders with standing and sitting 10/22/2018    Time  4    Period  Weeks    Status  Partially Met    Target Date  12/17/18      PT LONG TERM GOAL #5   Title  Pt FOTO score will be 75 to demonstrate an increase in functional mobility    Baseline  Pt FOTO score 77 (age norm 70) 10/22/2018    Time   4    Period  Weeks    Status  On-going    Target Date  12/17/18       Pt has throacic spine hypomobility and mutliple trigger points in traps L>R. Pt tolerated prone trigger point release and UPAs well. Pt reports ease in headache and cervical pain post manual therapy. Pt will benefit from further skilled therapy to increase pain-free cervical AROM to improve ease with ADLs.      Patient will benefit from skilled therapeutic intervention in order to improve the following deficits and impairments:  Improper body mechanics, Pain, Postural dysfunction, Decreased mobility, Decreased activity tolerance, Decreased range of motion, Hypomobility  Visit Diagnosis: Painful cervical range of motion  Neck pain  Joint stiffness  Abnormal posture     Problem List There are no active problems to display for this patient.  Pura Spice, PT, DPT # [id number] Chinita Greenland, SPT 11/21/2018, 7:49 AM   [facility] [address] [place], [place], [address] Phone: [phone]   Fax:  [phone]  Name: Steven Nielsen MRN: [id number] Date of Birth: 1960-03-25

## 2018-11-26 ENCOUNTER — Ambulatory Visit: Payer: Managed Care, Other (non HMO) | Admitting: Physical Therapy

## 2018-12-03 ENCOUNTER — Ambulatory Visit: Payer: Managed Care, Other (non HMO) | Admitting: Physical Therapy

## 2018-12-03 ENCOUNTER — Encounter: Payer: Self-pay | Admitting: Physical Therapy

## 2018-12-03 ENCOUNTER — Other Ambulatory Visit: Payer: Self-pay

## 2018-12-03 DIAGNOSIS — R293 Abnormal posture: Secondary | ICD-10-CM

## 2018-12-03 DIAGNOSIS — M542 Cervicalgia: Secondary | ICD-10-CM

## 2018-12-03 DIAGNOSIS — M2560 Stiffness of unspecified joint, not elsewhere classified: Secondary | ICD-10-CM

## 2018-12-03 NOTE — Therapy (Signed)
[facility] [address] [place], [place], [address] Phone: [phone]   Fax:  [phone]  Physical Therapy Treatment  Patient Details  Name: Steven Nielsen MRN: [id number] Date of Birth: Jan 12, 1961 Referring Provider (PT): Dr. Alverda Skeans   Encounter Date: 12/03/2018  PT End of Session - 12/04/18 1800    Visit Number  14    Number of Visits  17    Date for PT Re-Evaluation  12/17/18    PT Start Time  1430    PT Stop Time  1516    PT Time Calculation (min)  46 min    Activity Tolerance  Patient tolerated treatment well;Patient limited by pain    Behavior During Therapy  [facility] for tasks assessed/performed       History reviewed. No pertinent past medical history.  History reviewed. No pertinent surgical history.  There were no vitals filed for this visit.  Subjective Assessment - 12/03/18 1438    Subjective  Pt reports neck is stiff but not really painful roughly 2/10.  Pt has minor headache currently, also had headaches yesterday during [place].    Pertinent History  see medical chart.  Pt. known to PT    Limitations  Walking;Standing;House hold activities    Patient Stated Goals  Decrease neck discomfort/ pain with daily tasks.    Currently in Pain?  Yes    Pain Score  2     Pain Location  Neck         Manual Therapy: Cervical MET: flexion, extension, rotation - 4 times throughout range with 5 reps with 5 sec holds STM to upper traps - 10 min Supine upper trap stretch - 1x30 sec B Suboccipital release - 3x30 sec  Therapeutic Exercise: Cervical AROM: flexion, extension, rotation, SB - x15 each Supine pec stretch - 1x30 sec Supine cervical retraction - 2x10 Seated cervical retraction - 2x10 Seated scapular retraction - 2x10    PT Long Term Goals - 11/21/18 0747      PT LONG TERM GOAL #1   Title  Pt. will complete FOTO and score set goal to improve pain-free mobility.    Baseline  FOTO; FOTO  49 10/22/2018    Time  4    Period  Weeks    Status  Achieved    Target Date  11/21/18      PT LONG TERM GOAL #2   Title  Pt. will increase his cervical rotation ROM to >55 degrees in order to improve function and increase ability to participate in desired activities.    Baseline  Cervical flexion (44 deg.), ext. (45 deg.), L rotn. (46 deg.), R rotn. (48 deg.), L lat. flex. (40 deg.), R lat. flex. (40 deg.).  Several UT trigger point noted; Cervical rotation 65 to L and 70 to R, pain to L 10/22/2018    Time  4    Period  Weeks    Status  Achieved    Target Date  11/21/18      PT LONG TERM GOAL #3   Title  Pt. will report no neck pain with AROM (all planes) to improve daily mobility.    Baseline  Increase cervical pain with prolonged activity/ household tasks;  painful lateral flexion to L, rotation to L, and pain on L with extension all in upper trap 10/22/2018    Time  4    Period  Weeks    Status  Partially Met  Target Date  12/17/18      PT LONG TERM GOAL #4   Title  Pt. will demonstrate proper upright seated/standing posture to improve pain-free mobility.    Baseline  Forward head/ rounded shoulder posture; forward head posture/rounded shoulders with standing and sitting 10/22/2018    Time  4    Period  Weeks    Status  Partially Met    Target Date  12/17/18      PT LONG TERM GOAL #5   Title  Pt FOTO score will be 75 to demonstrate an increase in functional mobility    Baseline  Pt FOTO score 18 (age norm 19) 10/22/2018    Time  4    Period  Weeks    Status  On-going    Target Date  12/17/18            Plan - 12/04/18 1804    Clinical Impression Statement  Pt tolerated MET well with no immediate decrease in reported stiffness.  Pt reports increased pain at end range cervical R rotation and L SB AROM; no pain noted with MET at end range.  Pt will benefit from further skilled therapy to increase pain-free cervical AROM to improve ease with ADLs.    Stability/Clinical Decision  Making  Evolving/Moderate complexity    Clinical Decision Making  Moderate    Rehab Potential  Fair    PT Frequency  2x / week    PT Duration  4 weeks    PT Treatment/Interventions  ADLs/Self Care Home Management;Cryotherapy;Electrical Stimulation;Iontophoresis 22m/ml Dexamethasone;Moist Heat;Therapeutic exercise;Therapeutic activities;Functional mobility training;Neuromuscular re-education;Patient/family education;Passive range of motion;Manual techniques;Dry needling;Vestibular    PT Next Visit Plan  Progress shoulder strengthening exercises within pain-free range    PT Home Exercise Plan  see Handouts.  Pt. given prone T's and manual STM with tennis balls; recert/ assess for discharge    Consulted and Agree with Plan of Care  Patient       Patient will benefit from skilled therapeutic intervention in order to improve the following deficits and impairments:  Improper body mechanics, Pain, Postural dysfunction, Decreased mobility, Decreased activity tolerance, Decreased range of motion, Hypomobility  Visit Diagnosis: Painful cervical range of motion  Neck pain  Joint stiffness  Abnormal posture     Problem List There are no active problems to display for this patient.  MPura Spice PT, DPT # [id number] Greenland SPT 12/04/2018, 6:09 PM   [facility] [address]Phone: [phone]  Fax:  [phone] Name: Steven MITTONMRN: [id number] of Birth: 601-10-1960

## 2018-12-10 ENCOUNTER — Ambulatory Visit: Payer: Managed Care, Other (non HMO) | Admitting: Physical Therapy

## 2018-12-11 DIAGNOSIS — E119 Type 2 diabetes mellitus without complications: Secondary | ICD-10-CM | POA: Insufficient documentation

## 2018-12-17 ENCOUNTER — Ambulatory Visit: Payer: Managed Care, Other (non HMO) | Attending: Neurology | Admitting: Physical Therapy

## 2018-12-22 HISTORY — PX: COLONOSCOPY: SHX174

## 2019-03-16 DIAGNOSIS — E782 Mixed hyperlipidemia: Secondary | ICD-10-CM | POA: Insufficient documentation

## 2019-04-28 ENCOUNTER — Emergency Department: Payer: Managed Care, Other (non HMO)

## 2019-04-28 ENCOUNTER — Other Ambulatory Visit: Payer: Self-pay

## 2019-04-28 ENCOUNTER — Emergency Department
Admission: EM | Admit: 2019-04-28 | Discharge: 2019-04-28 | Disposition: A | Discharge status: Home / Self Care | Payer: Managed Care, Other (non HMO) | Attending: Emergency Medicine | Admitting: Emergency Medicine

## 2019-04-28 ENCOUNTER — Encounter: Payer: Self-pay | Admitting: Emergency Medicine

## 2019-04-28 DIAGNOSIS — R0600 Dyspnea, unspecified: Secondary | ICD-10-CM | POA: Insufficient documentation

## 2019-04-28 DIAGNOSIS — R002 Palpitations: Secondary | ICD-10-CM | POA: Insufficient documentation

## 2019-04-28 DIAGNOSIS — I10 Essential (primary) hypertension: Secondary | ICD-10-CM | POA: Insufficient documentation

## 2019-04-28 DIAGNOSIS — E119 Type 2 diabetes mellitus without complications: Secondary | ICD-10-CM | POA: Diagnosis not present

## 2019-04-28 HISTORY — DX: Essential (primary) hypertension: I10

## 2019-04-28 HISTORY — DX: Type 2 diabetes mellitus without complications: E11.9

## 2019-04-28 LAB — TROPONIN I (HIGH SENSITIVITY)
Troponin I (High Sensitivity): 6 ng/L (ref <18)
Troponin I (High Sensitivity): 6 ng/L (ref <18)

## 2019-04-28 LAB — BRAIN NATRIURETIC PEPTIDE: B Natriuretic Peptide: 62 pg/mL (ref 0.0–100.0)

## 2019-04-28 LAB — BASIC METABOLIC PANEL
Anion gap: 7 (ref 5–15)
BUN: 13 mg/dL (ref 6–20)
CO2: 25 mmol/L (ref 22–32)
Calcium: 9.6 mg/dL (ref 8.9–10.3)
Chloride: 105 mmol/L (ref 98–111)
Creatinine, Ser: 0.94 mg/dL (ref 0.61–1.24)
GFR calc Af Amer: >60 mL/min (ref >60)
GFR calc non Af Amer: >60 mL/min (ref >60)
Glucose, Bld: 196 mg/dL — ABNORMAL HIGH (ref 70–99)
Potassium: 3.8 mmol/L (ref 3.5–5.1)
Sodium: 137 mmol/L (ref 135–145)

## 2019-04-28 LAB — CBC
HCT: 42.9 % (ref 39.0–52.0)
Hemoglobin: 15.8 g/dL (ref 13.0–17.0)
MCH: 32.2 pg (ref 26.0–34.0)
MCHC: 36.8 g/dL — ABNORMAL HIGH (ref 30.0–36.0)
MCV: 87.6 fL (ref 80.0–100.0)
Platelets: 334 10*3/uL (ref 150–400)
RBC: 4.9 MIL/uL (ref 4.22–5.81)
RDW: 11.9 % (ref 11.5–15.5)
WBC: 10.7 10*3/uL — ABNORMAL HIGH (ref 4.0–10.5)
nRBC: 0 % (ref 0.0–0.2)

## 2019-04-28 LAB — FIBRIN DERIVATIVES D-DIMER (ARMC ONLY): Fibrin derivatives D-dimer (ARMC): 238.52 ng/mL (FEU) (ref 0.00–499.00)

## 2019-04-28 LAB — MAGNESIUM: Magnesium: 1.8 mg/dL (ref 1.7–2.4)

## 2019-04-28 MED ORDER — SODIUM CHLORIDE 0.9% FLUSH: 3.0000 mL | Freq: Once | INTRAVENOUS | Status: DC

## 2019-04-28 NOTE — ED Provider Notes (Signed)
[facility] Emergency Department Provider Note  ____________________________________________   First MD Initiated Contact with Patient 04/28/19 1402     (approximate)  I have reviewed the triage vital signs and the nursing notes.   HISTORY  Chief Complaint Palpitations and Shortness of Breath    HPI Steven Nielsen is a 59 y.o. male with below list of previous medical conditions including hypertension diabetes and premature ventricular contraction presents to the emergency department secondary to increasing "PVCs" since Sunday with associated dyspnea.  Patient admits to dyspnea with exertion that is relieved with rest.  Patient denies any chest pain.  Patient denies any nausea or vomiting.  Patient denies any dizziness or weakness.  Patient states that he is been experiencing persistent episodes of PVCs meaning no break in between episodes.  He states that this is markedly different than the previous episodes that he has had.       Past Medical History:  Diagnosis Date  . Diabetes mellitus without complication (HCC)   . Hypertension     There are no problems to display for this patient.   Past Surgical History:  Procedure Laterality Date  . SHOULDER ARTHROSCOPY      Prior to Admission medications   Not on File    Allergies Patient has no known allergies.  No family history on file.  Social History Social History   Tobacco Use  . Smoking status: Never Smoker  . Smokeless tobacco: Never Used  Substance Use Topics  . Alcohol use: Not Currently  . Drug use: Never    Review of Systems Constitutional: No fever/chills Eyes: No visual changes. ENT: No sore throat. Cardiovascular: Denies chest pain.  Positive for palpitations Respiratory: Denies shortness of breath. Gastrointestinal: No abdominal pain.  No nausea, no vomiting.  No diarrhea.  No constipation. Genitourinary: Negative for dysuria. Musculoskeletal: Negative for neck pain.   Negative for back pain. Integumentary: Negative for rash. Neurological: Negative for headaches, focal weakness or numbness.   ____________________________________________   PHYSICAL EXAM:  VITAL SIGNS: ED Triage Vitals  Enc Vitals Group     BP 04/28/19 1218 (!) 182/101     Pulse Rate 04/28/19 1218 89     Resp 04/28/19 1218 20     Temp 04/28/19 1218 98.9 F (37.2 C)     Temp Source 04/28/19 1218 Oral     SpO2 04/28/19 1218 98 %     Weight 04/28/19 1219 124.7 kg (275 lb)     Height 04/28/19 1219 1.854 m (6\' 1" )     Head Circumference --      Peak Flow --      Pain Score 04/28/19 1214 0     Pain Loc --      Pain Edu? --      Excl. in GC? --     Constitutional: Alert and oriented.  Eyes: Conjunctivae are normal.  Mouth/Throat: Patient is wearing a mask. Neck: No stridor.  No meningeal signs.   Cardiovascular: Normal rate, regular rhythm. Good peripheral circulation. Grossly normal heart sounds. Respiratory: Normal respiratory effort.  No retractions. Gastrointestinal: Soft and nontender. No distention.  Musculoskeletal: No lower extremity tenderness nor edema. No gross deformities of extremities. Neurologic:  Normal speech and language. No gross focal neurologic deficits are appreciated.  Skin:  Skin is warm, dry and intact. Psychiatric: Mood and affect are normal. Speech and behavior are normal.  ____________________________________________   LABS (all labs ordered are listed, but only abnormal results are displayed)  Labs Reviewed  BASIC METABOLIC PANEL - Abnormal; Notable for the following components:      Result Value   Glucose, Bld 196 (*)    All other components within normal limits  CBC - Abnormal; Notable for the following components:   WBC 10.7 (*)    MCHC 36.8 (*)    All other components within normal limits  MAGNESIUM  FIBRIN DERIVATIVES D-DIMER ([facility] ONLY)  TROPONIN I (HIGH SENSITIVITY)  TROPONIN I (HIGH SENSITIVITY)    ____________________________________________  EKG  ED ECG REPORT I,  N Capria Cartaya, the attending physician, personally viewed and interpreted this ECG.   Date: 04/28/2019  EKG Time: 12:13 PM  Rate: 83  Rhythm: Sinus rhythm with sinus arrhythmia  Axis: Normal  Intervals: Normal  ST&T Change: None  ____________________________________________  RADIOLOGY I,  N Brentin Shin, personally viewed and evaluated these images (plain radiographs) as part of my medical decision making, as well as reviewing the written report by the radiologist.  ED MD interpretation: Normal chest x-ray per radiologist  Official radiology report(s): DG Chest 2 View  Result Date: 04/28/2019 CLINICAL DATA:  Palpitations.  Shortness of breath. EXAM: CHEST - 2 VIEW COMPARISON:  Chest x-ray dated 03/02/2008 FINDINGS: The heart size and mediastinal contours are within normal limits. Both lungs are clear. No significant bone abnormality. Previous surgery at the left glenoid. IMPRESSION: Essentially normal chest. Electronically Signed   By: Lorriane Shire M.D.   On: 04/28/2019 13:25    ________  Procedures   ____________________________________________   INITIAL IMPRESSION / MDM / [place] / ED COURSE  As part of my medical decision making, I reviewed the following data within the electronic MEDICAL RECORD NUMBER  59 year old male presented with above-stated history and physical exam secondary to palpitations.  Patient's EKG revealed evidence of a sinus arrhythmia.  While in the emergency department patient was placed on the monitor which he remained in a normal sinus rhythm throughout without any ectopy.  Patient's laboratory data notable for high-sensitivity troponin negative x2.  D-dimer also negative.  Spoke to the patient and his wife at length regarding the importance of following up with Dr. Ubaldo Glassing cardiology for further outpatient evaluation.      ____________________________________________  FINAL CLINICAL IMPRESSION(S) / ED DIAGNOSES  Final diagnoses:  Palpitations     MEDICATIONS GIVEN DURING THIS VISIT:  Medications  sodium chloride flush (NS) 0.9 % injection 3 mL (has no administration in time range)     ED Discharge Orders    None      *Please note:  Mariel Gaudin Burrows was evaluated in Emergency Department on 04/28/2019 for the symptoms described in the history of present illness. He was evaluated in the context of the global COVID-19 pandemic, which necessitated consideration that the patient might be at risk for infection with the SARS-CoV-2 virus that causes COVID-19. Institutional protocols and algorithms that pertain to the evaluation of patients at risk for COVID-19 are in a state of rapid change based on information released by regulatory bodies including the CDC and federal and state organizations. These policies and algorithms were followed during the patient's care in the ED.  Some ED evaluations and interventions may be delayed as a result of limited staffing during the pandemic.*  Note:  This document was prepared using Dragon voice recognition software and may include unintentional dictation errors.   Gregor Hams, MD 04/28/19 2145

## 2019-04-28 NOTE — ED Notes (Signed)
See triage note, pt states it feels like his heart beat is irregular. Denies CP, dizziness. Reports fatigue

## 2019-04-28 NOTE — ED Triage Notes (Signed)
Pt presents to ED via POV with c/o PVC's x several days. Pt also c/o SOB. Pt A&O x 4 in triage, skin warm, dry, and intact, pt states dyspnea with exertion that is relieved with laying down. While sitting in triage respirations even and unlabored.

## 2019-05-13 ENCOUNTER — Encounter: Payer: Self-pay | Admitting: Hematology and Oncology

## 2019-05-13 NOTE — Progress Notes (Signed)
[facility]  [address], [address] [place],  [address] Phone: [phone]  Fax: [phone]   Clinic Day:  05/15/2019  Referring physician: Valera Castle, *  Chief Complaint: Steven Nielsen is a 59 y.o. male with leukocytosis who is referred in consultation with Dr. Johny Drilling for assessment and management.   HPI: The patient states that he has been aware of his elevated WBC for about a year. He usually gets his labs checked when he goes in for his annual check ups with his PCP. He has a history of hypertension and diabetes.  He was seen by Dr. Kym Groom on 03/16/2019 for palpitations.  Labs were ordered.  TSH and free T4 were normal.  WBC was elevated.   Labs followed: 03/02/2008 ([facility]): hematocrit 43.6, hemoglobin 15.5, MCV 91.5, platelets 334,000, WBC 17400.  Monocyte count 1400 (8%). 03/05/2013 ([facility]): hematocrit 42.0, hemoglobin 14.7, MCV 91, WBC 9800.  03/13/2016 ([facility]): hematocrit 42.0, hemoglobin 15.8, MCV 90, platelets 280,000, WBC 8800, ANC 6200. Monocyte count 1000 (11.2%) 10/17/2018 ([facility]): hematocrit 43.3, hemoglobin 15.4, MCV 89, platelets 287,000, WBC 11300.   03/16/2019 ([facility]): hematocrit 42.6, hemoglobin 15.2, MCV 89, platelets 328,000, WBC 11900, ANC 7400.  Monocyte count 1200 (9.8%). 04/24/2019 ([facility]): hematocrit 40.0, hemoglobin 14.3, MCV 88, platelets 313,000, WBC 11000, ANC 6700.  Monocyte count 1100 (9.5%). 04/28/2019 ([facility]): hematocrit 42.9, hemoglobin 15.8, MCV 87.6, platelets 334,000, WBC 10700.   Symptomatically, he feels "pretty good".  He comments that he always has something going on. He complains of a history of severe migraines which caused him to quit his job. Dr. Kym Groom put him on BP medication which resolved his migraines. He had nystagmus with migraines.  It has been six months since his last migraine. His wife says with exertion he has shortness of breath; symptoms have been ongoing for a couple of weeks.   He  denies any issues with recurrent infections.  He denies any oral steroids.  He denied any steroid injections.  He admits to reflux. His wife says he had an upper endoscopy approximately 10 years ago for his reflux but nothing was found; reflux runs throughout his whole family. He has a history of diarrhea; since he changed his diet, it has gotten better. Colonoscopy on 12/22/2018 revealed a 4 mm polyp in the ascending colon.    He denies any urinary symptoms. His wife says he has osteoarthritis. He gets checked for skin cancer every few years, the last time he was checked was 2 years ago. He denies any recurring infections. He has neuropathy in his legs, feet, and fingers. His wife says its due to statin and blood sugar. He is often fatigued; he likes to lay down after his morning errands. He would like his next visit to be virtual.   Past Medical History:  Diagnosis Date  . Diabetes mellitus without complication ([place])   . Hypertension     Past Surgical History:  Procedure Laterality Date  . SHOULDER ARTHROSCOPY      Family History  Problem Relation Age of Onset  . Hyperlipidemia Mother   . Stroke Father   . Hyperlipidemia Father   . Diabetes Father   . Cancer Father   . Hyperlipidemia Sister   . Diabetes Sister   . Cancer Paternal Uncle        colon  . Stroke Paternal Grandmother   . Hypertension Paternal Grandfather   . Diabetes Paternal Grandfather   His father had multiple myeloma and Hodgkin's disease.  Social History:  reports that he smoked 1/2 ppd x 10 years (stopped 05/2012). He has never used smokeless tobacco. He reports previous alcohol use. He reports that he does not use drugs.  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 He lives in [place]. His wife's name is [place]. The patient is accompanied by his wife on the Ipad today.  Allergies:  Allergies  Allergen Reactions  . Sumatriptan Rash and Shortness Of Breath    Flushing, diaphoresis, & shortness of breath     Current Medications: Current Outpatient Medications  Medication Sig Dispense Refill  . Acetaminophen (TYLENOL 8 HOUR ARTHRITIS PAIN PO)     . baclofen (LIORESAL) 10 MG tablet Take 10 mg by mouth 2 (two) times daily.    . Blood Glucose Monitoring Suppl (FIFTY50 GLUCOSE METER 2.0) w/Device KIT Use as directed    . DULoxetine (CYMBALTA) 30 MG capsule TAKE 1 CAPSULE(30 MG) BY MOUTH EVERY DAY    . fluticasone (FLONASE) 50 MCG/ACT nasal spray Place into the nose.    . gabapentin (NEURONTIN) 300 MG capsule Take 300 mg by mouth daily.    Marland Kitchen glucose blood (AGAMATRIX PRESTO TEST) test strip Use 1 each (1 strip total) once daily Use as instructed.    Marland Kitchen losartan (COZAAR) 100 MG tablet Take 100 mg by mouth daily.    . metFORMIN (GLUCOPHAGE-XR) 750 MG 24 hr tablet Take 750 mg by mouth daily.    . Omega-3 Fatty Acids (FISH OIL) 1000 MG CAPS Take 1,000 mg by mouth daily.    . Omeprazole 20 MG TBDD     . Oxymetazoline HCl (NASAL SPRAY) 0.05 % SOLN Place into the nose.    Marland Kitchen Phenylephrine HCl (SUDAFED PE CONGESTION PO)     . propranolol (INDERAL) 40 MG tablet Take 40 mg by mouth 2 (two) times daily.    . butalbital-acetaminophen-caffeine (FIORICET) 50-325-40 MG tablet Take by mouth.    . zolmitriptan (ZOMIG) 5 MG tablet 5 mg once as needed     No current facility-administered medications for this visit.    Review of Systems  Constitutional: Negative for chills, diaphoresis, fever, malaise/fatigue and weight loss.       Feels "tired all of the time".  HENT: Negative for congestion, hearing loss and sore throat.   Eyes: Negative for blurred vision, double vision, photophobia, pain, discharge and redness.  Respiratory: Positive for shortness of breath (exertion x 2 weeks). Negative for cough and wheezing.   Cardiovascular: Negative for chest pain, palpitations and leg swelling.       Irregular rhythm.  Gastrointestinal: Positive for diarrhea. Negative for abdominal pain, blood in stool, constipation,  nausea and vomiting.       Reflux.  Genitourinary: Negative for dysuria, frequency and urgency.  Musculoskeletal: Positive for back pain and joint pain (osteoarthritis of the knee). Negative for falls and myalgias.  Skin: Negative for itching and rash.  Neurological: Positive for sensory change (neuropathy in feet, legs, and fingers) and weakness (easily fatigued). Negative for dizziness, speech change and headaches (vestibular migraines; none in 6 months).  Endo/Heme/Allergies: Negative for environmental allergies and polydipsia. Does not bruise/bleed easily.       Diabetes on Metformin.  Psychiatric/Behavioral: Negative for depression. The patient is not nervous/anxious and does not have insomnia.   All other systems reviewed and are negative.  Performance status (ECOG): 0 - Asymptomatic  Vitals Blood pressure (!) 156/74, pulse 61, temperature (!) 97.2 F (36.2 C), temperature source Tympanic, resp. rate 18, height  $'6\' 1"'k$  (1.854 m), weight 277 lb 12.5 oz (126 kg), SpO2 99 %.   Physical Exam  Constitutional: He is oriented to person, place, and time. He appears well-developed and well-nourished. No distress. Face mask in place.  HENT:  Head: Normocephalic and atraumatic.  Mouth/Throat: Oropharynx is clear and moist and mucous membranes are normal. No oral lesions.  Black with graying.  Goatee.  Eyes: Pupils are equal, round, and reactive to light. Conjunctivae and EOM are normal. No scleral icterus.  Glasses.  Blue eyes.  Neck: No JVD present.  Cardiovascular: Normal rate, regular rhythm and normal heart sounds. Exam reveals no gallop and no friction rub.  No murmur heard. Pulmonary/Chest: Effort normal and breath sounds normal. No respiratory distress. He has no wheezes. He has no rhonchi. He has no rales.  Abdominal: Soft. Normal appearance and bowel sounds are normal. He exhibits no distension and no mass. There is no hepatosplenomegaly. There is no abdominal tenderness. There is no  rebound, no guarding and no CVA tenderness.  Musculoskeletal:        General: No tenderness or edema. Normal range of motion.     Cervical back: Normal range of motion and neck supple.  Lymphadenopathy:       Head (right side): No preauricular, no posterior auricular and no occipital adenopathy present.       Head (left side): No preauricular, no posterior auricular and no occipital adenopathy present.    He has no cervical adenopathy.    He has no axillary adenopathy.       Right: No inguinal and no supraclavicular adenopathy present.       Left: No inguinal and no supraclavicular adenopathy present.  Neurological: He is alert and oriented to person, place, and time.  Skin: Skin is warm, dry and intact. No bruising, no lesion and no rash noted. He is not diaphoretic. No erythema. No pallor.  Psychiatric: He has a normal mood and affect. His behavior is normal. Judgment and thought content normal.  Nursing note and vitals reviewed.   No visits with results within 3 Day(s) from this visit.  Latest known visit with results is:  Admission on 04/28/2019, Discharged on 04/28/2019  Component Date Value Ref Range Status  . Sodium 04/28/2019 137  135 - 145 mmol/L Final  . Potassium 04/28/2019 3.8  3.5 - 5.1 mmol/L Final  . Chloride 04/28/2019 105  98 - 111 mmol/L Final  . CO2 04/28/2019 25  22 - 32 mmol/L Final  . Glucose, Bld 04/28/2019 196* 70 - 99 mg/dL Final   Glucose reference range applies only to samples taken after fasting for at least 8 hours.  . BUN 04/28/2019 13  6 - 20 mg/dL Final  . Creatinine, Ser 04/28/2019 0.94  0.61 - 1.24 mg/dL Final  . Calcium 04/28/2019 9.6  8.9 - 10.3 mg/dL Final  . GFR calc non Af Amer 04/28/2019 >60  >60 mL/min Final  . GFR calc Af Amer 04/28/2019 >60  >60 mL/min Final  . Anion gap 04/28/2019 7  5 - 15 Final   Performed at [facility], [address], [place], [place] [address]  . WBC 04/28/2019 10.7* 4.0 - 10.5 K/uL Final  . RBC  04/28/2019 4.90  4.22 - 5.81 MIL/uL Final  . Hemoglobin 04/28/2019 15.8  13.0 - 17.0 g/dL Final  . HCT 04/28/2019 42.9  39.0 - 52.0 % Final  . MCV 04/28/2019 87.6  80.0 - 100.0 fL Final  . MCH 04/28/2019 32.2  26.0 - 34.0 pg Final  . MCHC 04/28/2019 36.8* 30.0 - 36.0 g/dL Final  . RDW 04/28/2019 11.9  11.5 - 15.5 % Final  . Platelets 04/28/2019 334  150 - 400 K/uL Final  . nRBC 04/28/2019 0.0  0.0 - 0.2 % Final   Performed at [facility], [address], [place], [place] [address]  . Troponin I (High Sensitivity) 04/28/2019 6  <18 ng/L Final   Comment: (NOTE) Elevated high sensitivity troponin I (hsTnI) values and significant  changes across serial measurements may suggest ACS but many other  chronic and acute conditions are known to elevate hsTnI results.  Refer to the "Links" section for chest pain algorithms and additional  guidance. Performed at [facility], [address] [place], [place] [address]   . Troponin I (High Sensitivity) 04/28/2019 6  <18 ng/L Final   Comment: (NOTE) Elevated high sensitivity troponin I (hsTnI) values and significant  changes across serial measurements may suggest ACS but many other  chronic and acute conditions are known to elevate hsTnI results.  Refer to the "Links" section for chest pain algorithms and additional  guidance. Performed at [facility], [address] [place], [place] [address]   . Magnesium 04/28/2019 1.8  1.7 - 2.4 mg/dL Final   Performed at [facility], [address] [place], [place] [address]  . Fibrin derivatives D-dimer ([facility]) 04/28/2019 238.52  0.00 - 499.00 ng/mL (FEU) Final   Comment: (NOTE) <> Exclusion of Venous Thromboembolism (VTE) - OUTPATIENT ONLY   (Emergency Department or [place])   0-499 ng/ml (FEU): With a low to intermediate pretest probability                      for VTE this test result excludes the diagnosis                      of VTE.   >499 ng/ml (FEU) :  VTE not excluded; additional work up for VTE is                      required. <> Testing on Inpatients and Evaluation of Disseminated Intravascular   Coagulation (DIC) Reference Range:   0-499 ng/ml (FEU) Performed at [facility], [address] [place],  [address]   . B Natriuretic Peptide 04/28/2019 62.0  0.0 - 100.0 pg/mL Final   Performed at [facility], [address] [place],  [address]    Assessment:  BAKARY BRAMER is a 59 y.o. male with mild leukocytosis since 10/2018.  WBC has ranged between 10,700 - 11,900.  Differential has been predominantly neutrophils.  Monocyte count has ranged between 1000 -1200 (10% of differential).  He does not smoke.  He does not take steroids.  He denies recurrent infections.  Symptomatically, he feels fatigued all of the time.  Exam reveals no adenopathy or hepatosplenomegaly.  Plan: 1.  Labs today:  CBC with diff, BCR-ABL, sed rate, CRP, flow cytometry. 2.   Peripheral blood for path review. 3.   Mild leukocytosis  Review differential diagnosis.  He may have a reactive leukocytosis.  As monocyte has been > 1000 and approximately 10% of differential, possible CMML.  Discuss work-up. 4.   RTC in 1 week for MD assessment (virtual) and review of work-up.  I discussed the assessment and treatment plan with the patient.  The patient was provided an opportunity to ask questions and  all were answered.  The patient agreed with the plan and demonstrated an understanding of the instructions.  The patient was advised to call back if the symptoms worsen or if the condition fails to improve as anticipated.  I provided 22 minutes (9:30 AM - 9:52 AM) of face-to-face time during this this encounter and > 50% was spent counseling as documented under my assessment and plan.  An additional 10 minutes were spent reviewing his chart (Epic and Care Everywhere) including notes, labs, and imaging studies.    Joab Carden C. Mike Gip,  MD, PhD    05/15/2019, 9:30 AM  I, Heywood Footman, am acting as Education administrator for [employer]. Mike Gip, MD, PhD.  I, Jashawna Reever C. Mike Gip, MD, have reviewed the above documentation for accuracy and completeness, and I agree with the above.

## 2019-05-15 ENCOUNTER — Inpatient Hospital Stay: Payer: Managed Care, Other (non HMO)

## 2019-05-15 ENCOUNTER — Inpatient Hospital Stay: Payer: Managed Care, Other (non HMO) | Attending: Hematology and Oncology | Admitting: Hematology and Oncology

## 2019-05-15 ENCOUNTER — Other Ambulatory Visit: Payer: Self-pay

## 2019-05-15 ENCOUNTER — Encounter: Payer: Self-pay | Admitting: Hematology and Oncology

## 2019-05-15 VITALS — BP 156/74 | HR 61 | Temp 97.2°F | Resp 18 | Ht 73.0 in | Wt 277.8 lb

## 2019-05-15 DIAGNOSIS — Z8349 Family history of other endocrine, nutritional and metabolic diseases: Secondary | ICD-10-CM

## 2019-05-15 DIAGNOSIS — R197 Diarrhea, unspecified: Secondary | ICD-10-CM | POA: Insufficient documentation

## 2019-05-15 DIAGNOSIS — Z823 Family history of stroke: Secondary | ICD-10-CM | POA: Diagnosis not present

## 2019-05-15 DIAGNOSIS — Z808 Family history of malignant neoplasm of other organs or systems: Secondary | ICD-10-CM

## 2019-05-15 DIAGNOSIS — Z833 Family history of diabetes mellitus: Secondary | ICD-10-CM

## 2019-05-15 DIAGNOSIS — Z809 Family history of malignant neoplasm, unspecified: Secondary | ICD-10-CM

## 2019-05-15 DIAGNOSIS — D72829 Elevated white blood cell count, unspecified: Secondary | ICD-10-CM

## 2019-05-15 DIAGNOSIS — Z85828 Personal history of other malignant neoplasm of skin: Secondary | ICD-10-CM | POA: Insufficient documentation

## 2019-05-15 DIAGNOSIS — G629 Polyneuropathy, unspecified: Secondary | ICD-10-CM | POA: Diagnosis not present

## 2019-05-15 DIAGNOSIS — I10 Essential (primary) hypertension: Secondary | ICD-10-CM | POA: Diagnosis not present

## 2019-05-15 DIAGNOSIS — R5383 Other fatigue: Secondary | ICD-10-CM

## 2019-05-15 DIAGNOSIS — K219 Gastro-esophageal reflux disease without esophagitis: Secondary | ICD-10-CM | POA: Insufficient documentation

## 2019-05-15 DIAGNOSIS — E119 Type 2 diabetes mellitus without complications: Secondary | ICD-10-CM | POA: Diagnosis not present

## 2019-05-15 DIAGNOSIS — R002 Palpitations: Secondary | ICD-10-CM

## 2019-05-15 DIAGNOSIS — Z8249 Family history of ischemic heart disease and other diseases of the circulatory system: Secondary | ICD-10-CM | POA: Diagnosis not present

## 2019-05-15 DIAGNOSIS — Z79899 Other long term (current) drug therapy: Secondary | ICD-10-CM | POA: Diagnosis not present

## 2019-05-15 DIAGNOSIS — Z800 Family history of malignant neoplasm of digestive organs: Secondary | ICD-10-CM | POA: Insufficient documentation

## 2019-05-15 LAB — CBC WITH DIFFERENTIAL/PLATELET
Abs Immature Granulocytes: 0.08 10*3/uL — ABNORMAL HIGH (ref 0.00–0.07)
Basophils Absolute: 0.1 10*3/uL (ref 0.0–0.1)
Basophils Relative: 1 %
Eosinophils Absolute: 0.5 10*3/uL (ref 0.0–0.5)
Eosinophils Relative: 5 %
HCT: 40.5 % (ref 39.0–52.0)
Hemoglobin: 14.6 g/dL (ref 13.0–17.0)
Immature Granulocytes: 1 %
Lymphocytes Relative: 20 %
Lymphs Abs: 2 10*3/uL (ref 0.7–4.0)
MCH: 31.7 pg (ref 26.0–34.0)
MCHC: 36 g/dL (ref 30.0–36.0)
MCV: 87.9 fL (ref 80.0–100.0)
Monocytes Absolute: 0.8 10*3/uL (ref 0.1–1.0)
Monocytes Relative: 8 %
Neutro Abs: 6.6 10*3/uL (ref 1.7–7.7)
Neutrophils Relative %: 65 %
Platelets: 303 10*3/uL (ref 150–400)
RBC: 4.61 MIL/uL (ref 4.22–5.81)
RDW: 12.6 % (ref 11.5–15.5)
WBC: 10 10*3/uL (ref 4.0–10.5)
nRBC: 0 % (ref 0.0–0.2)

## 2019-05-15 LAB — PATHOLOGIST SMEAR REVIEW

## 2019-05-15 LAB — SEDIMENTATION RATE: Sed Rate: 10 mm/hr (ref 0–20)

## 2019-05-16 LAB — C-REACTIVE PROTEIN: CRP: 1.3 mg/dL — ABNORMAL HIGH (ref <1.0)

## 2019-05-18 LAB — COMP PANEL: LEUKEMIA/LYMPHOMA

## 2019-05-21 ENCOUNTER — Other Ambulatory Visit: Payer: Self-pay

## 2019-05-21 NOTE — Progress Notes (Signed)
[facility]  [address] [place], [place] [address] Phone: [phone]  Fax: [phone]   Telephone Office Visit:  05/22/2019  Referring physician: Valera Nielsen, *  I connected with Steven Nielsen on 05/22/19 at 10:50 AM by telephone and verified that I was speaking with the correct person using 2 identifiers.  The patient was at  home.  I discussed the limitations, risk, security and privacy concerns of performing an evaluation and management service by telephone and the availability of in person appointments.  I also discussed with the patient that there may be a patient responsible charge related to this service.  The patient expressed understanding and agreed to proceed.   Chief Complaint: Steven Nielsen is a 59 y.o. male with mild leukocytosis who is seen for review of workup and discussion regarding direction of therapy.   HPI: The patient was last seen in the hematology clinic on 05/15/2019 for initial consultation. He has mild leukocytosis since 10/2018.  Differential had been predominantly neutrophils. Monocyte count had ranged between 1000 - 1200 (10% of differential).  Workup revealed a hematocrit 40.5, hemoglobin 14.6, MCV 87.9, Platelets 303,000, WBC 10000 with an ANC 6600.  Monocyte count was 800 (8% of differential).  Sed rate was 10 and CRP 1.3 (< 1.0). Flow cytometry revealed no significant immunophenotypic abnormality detected. There was no monoclonal B cell population was detected.  Peripheral smear revealed normal morphology of RBCs, WBCs, and platelets.  He was having technical difficulties, so today's visit was changed to telephone visit. He has been doing well since his last visit. His labs were discussed. He was happy by hearing no abnormalities were detected. He had a stress test yesterday. His cardiologist noted all his tests came back normal except tachycardia. He is easily fatigued; he often gets winded when we walks up  his stairs.    Past Medical History:  Diagnosis Date  . Diabetes mellitus without complication ([place])   . Hypertension     Past Surgical History:  Procedure Laterality Date  . SHOULDER ARTHROSCOPY      Family History  Problem Relation Age of Onset  . Hyperlipidemia Mother   . Stroke Father   . Hyperlipidemia Father   . Diabetes Father   . Cancer Father   . Hyperlipidemia Sister   . Diabetes Sister   . Cancer Paternal Uncle        colon  . Stroke Paternal Grandmother   . Hypertension Paternal Grandfather   . Diabetes Paternal Grandfather     Social History:  reports that he has never smoked. He has never used smokeless tobacco. He reports previous alcohol use. He reports that he does not use drugs. He previously smoked 1/2 ppd x 10 years (stopped 05/2012). 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 He lives in [place]. His wife's name is Niotaze.  The patient is accompanied by his wife today.  Participants in the patient's visit and their role in the encounter included the patient, Steven Nielsen, and Steven Nielsen, CMA, today.  The intake visit was provided by Steven Nielsen, CMA.   Allergies:  Allergies  Allergen Reactions  . Sumatriptan Rash and Shortness Of Breath    Flushing, diaphoresis, & shortness of breath    Current Medications: Current Outpatient Medications  Medication Sig Dispense Refill  . Blood Glucose Monitoring Suppl (FIFTY50 GLUCOSE METER 2.0) w/Device KIT Use as directed    . DULoxetine (CYMBALTA) 30 MG capsule TAKE 1 CAPSULE(30 MG)  BY MOUTH EVERY DAY    . fluticasone (FLONASE) 50 MCG/ACT nasal spray Place into the nose.    . gabapentin (NEURONTIN) 300 MG capsule Take 300 mg by mouth daily.    Marland Kitchen glucose blood (AGAMATRIX PRESTO TEST) test strip Use 1 each (1 strip total) once daily Use as instructed.    Marland Kitchen losartan (COZAAR) 100 MG tablet Take 100 mg by mouth daily.    . metFORMIN (GLUCOPHAGE-XR) 750 MG 24 hr tablet Take 750 mg by mouth daily.    .  Omega-3 Fatty Acids (FISH OIL) 1000 MG CAPS Take 1,000 mg by mouth daily.    . Omeprazole 20 MG TBDD     . Oxymetazoline HCl (NASAL SPRAY) 0.05 % SOLN Place into the nose.    Marland Kitchen Phenylephrine HCl (SUDAFED PE CONGESTION PO)     . propranolol (INDERAL) 40 MG tablet Take 40 mg by mouth 2 (two) times daily.    . Acetaminophen (TYLENOL 8 HOUR ARTHRITIS PAIN PO)     . baclofen (LIORESAL) 10 MG tablet Take 10 mg by mouth 2 (two) times daily.    . butalbital-acetaminophen-caffeine (FIORICET) 50-325-40 MG tablet Take by mouth.    . zolmitriptan (ZOMIG) 5 MG tablet 5 mg once as needed     No current facility-administered medications for this visit.    Review of Systems  Constitutional: Positive for malaise/fatigue (easily fatigued). Negative for chills, diaphoresis, fever and weight loss.  HENT: Negative for congestion, hearing loss and sore throat.   Eyes: Negative for blurred vision and double vision.  Respiratory: Positive for shortness of breath. Negative for cough.   Cardiovascular: Negative for chest pain, palpitations and leg swelling.       Stress test yesterday.  Gastrointestinal: Negative for abdominal pain, blood in stool, constipation, diarrhea, nausea and vomiting.       Reflux.  Genitourinary: Negative for frequency and urgency.  Musculoskeletal: Positive for back pain and joint pain (osteoarthritis). Negative for falls and myalgias.  Skin: Negative for itching and rash.  Neurological: Positive for sensory change (neuropathy in feet, legs, and fingers). Negative for dizziness, speech change, weakness and headaches.  Psychiatric/Behavioral: Negative for depression. The patient is not nervous/anxious and does not have insomnia.   All other systems reviewed and are negative.  Performance status (ECOG): 0 - Asymptomatic  Vitals There were no vitals taken for this visit.    No visits with results within 3 Day(s) from this visit.  Latest known visit with results is:  Appointment on  05/15/2019  Component Date Value Ref Range Status  . PATH INTERP XXX-IMP 05/15/2019 Comment   Final   No significant immunophenotypic abnormality detected  . CLINICAL INFO 05/15/2019 Comment   Corrected   Comment: (NOTE) Leukocytosis Accompanying CBC dated 05/15/2019 shows: WBC count 10.0, Neu 6.6, Lym 2.0, Mon 0.8.   Marland Kitchen Specimen Type 05/15/2019 Comment   Final   Peripheral blood  . ASSESSMENT OF LEUKOCYTES 05/15/2019 Comment   Final   Comment: (NOTE) No monoclonal B cell population is detected. kappa:lambda ratio 1.2 There is no loss of, or aberrant expression of, the pan T cell antigens to suggest a neoplastic T cell process. CD4:CD8 ratio 3.0 No circulating blasts are detected. Rare granulocytes show left-shifted maturation. Analysis of the lymphocyte population shows: B cells 13%, T cells 74%, NK cells 13%.   . % Viable Cells 05/15/2019 Comment   Corrected   85%  . ANALYSIS AND GATING STRATEGY 05/15/2019 Comment   Final   8  color analysis with CD45/SSC gating  . IMMUNOPHENOTYPING STUDY 05/15/2019 Comment   Final   Comment: (NOTE) CD2       Normal         CD3       Normal CD4       Normal         CD5       Normal CD7       Normal         CD8       Normal CD10      Normal         CD11b     Normal CD13      Normal         CD14      Normal CD16      Normal         CD19      Normal CD20      Normal         CD33      Normal CD34      Normal         CD38      Normal CD45      Normal         CD56      Normal CD57      Normal         CD117     Normal HLA-DR    Normal         KAPPA     Normal LAMBDA    Normal         CD64      Normal   . PATHOLOGIST NAME 05/15/2019 Comment   Final   Henrietta Hoover, M.D.  . COMMENT: 05/15/2019 Comment   Corrected   Comment: (NOTE) Each antibody in this assay was utilized to assess for potential abnormalities of studied cell populations or to characterize identified abnormalities. This test was developed and its performance characteristics  determined by [facility].  It has not been cleared or approved by the U.S. Food and Drug Administration. The FDA has determined that such clearance or approval is not necessary. This test is used for clinical purposes.  It should not be regarded as investigational or for research. Performed At: -[facility] [address] [place], [place] [id number] Katina Degree MDPhD [id number] Performed At: [facility] [address] [place], [place] [id number] Katina Degree MDPhD [id number]   . Path Review 05/15/2019 Blood smear is reviewed.   Final   Comment: Reported history of leukocytosis is noted. Morphology of RBCs, WBCs, and platelets within normal limits. No evidence of circulating blasts. Reviewed by Elmon Kirschner, M.D. Performed at [facility], [address], [place], [place] [address]   . CRP 05/15/2019 1.3* <1.0 mg/dL Final   Performed at [facility] [address] [place], [place] [address]  . Sed Rate 05/15/2019 10  0 - 20 mm/hr Final   Performed at [facility], [address], [place], [place] [address]  . WBC 05/15/2019 10.0  4.0 - 10.5 K/uL Final  . RBC 05/15/2019 4.61  4.22 - 5.81 MIL/uL Final  . Hemoglobin 05/15/2019 14.6  13.0 - 17.0 g/dL Final  . HCT 05/15/2019 40.5  39.0 - 52.0 % Final  . MCV 05/15/2019 87.9  80.0 - 100.0 fL Final  . MCH 05/15/2019 31.7  26.0 - 34.0 pg Final  . MCHC 05/15/2019 36.0  30.0 - 36.0  g/dL Final  . RDW 05/15/2019 12.6  11.5 - 15.5 % Final  . Platelets 05/15/2019 303  150 - 400 K/uL Final  . nRBC 05/15/2019 0.0  0.0 - 0.2 % Final  . Neutrophils Relative % 05/15/2019 65  % Final  . Neutro Abs 05/15/2019 6.6  1.7 - 7.7 K/uL Final  . Lymphocytes Relative 05/15/2019 20  % Final  . Lymphs Abs 05/15/2019 2.0  0.7 - 4.0 K/uL Final  . Monocytes Relative 05/15/2019 8  % Final  . Monocytes Absolute 05/15/2019 0.8  0.1 - 1.0 K/uL Final  . Eosinophils Relative 05/15/2019 5  % Final  . Eosinophils Absolute  05/15/2019 0.5  0.0 - 0.5 K/uL Final  . Basophils Relative 05/15/2019 1  % Final  . Basophils Absolute 05/15/2019 0.1  0.0 - 0.1 K/uL Final  . Immature Granulocytes 05/15/2019 1  % Final  . Abs Immature Granulocytes 05/15/2019 0.08* 0.00 - 0.07 K/uL Final   Performed at [facility], [address] [place], [place] [address]    Assessment:  Prithvi Kooi Abood is a 59 y.o. male with mild leukocytosis since 10/2018.  Differential has been predominantly neutrophils.  Monocyte count has ranged between 1000 -1200 (10% of differential).  Workup on 05/15/2019 revealed a hematocrit 40.5, hemoglobin 14.6, MCV 87.9, Platelets 303,000, WBC 10,000 with an ANC 6600.  Monocyte count was 800 (8% of differential).  Sed rate was 10 and CRP 1.3 (< 1.0).  Flow cytometry revealed no significant immunophenotypic abnormality detected. There was no monoclonal B cell population was detected.  Peripheral smear revealed normal morphology of RBCs, WBCs, and platelets.  Symptomatically, he denies any complaints.  He denies any recurrent infections.  Plan: 1.   Review labs from 05/15/2019. 2.   Mild leukocytosis  WBC 10,000 with an ANC of 6600.             Counts were normal on the day of his evaluation.             Work-up revealed no abnormality.  Reassurance provided. 3.   RTC prn.  I discussed the assessment and treatment plan with the patient.  The patient was provided an opportunity to ask questions and all were answered.  The patient agreed with the plan and demonstrated an understanding of the instructions.  The patient was advised to call back if the symptoms worsen or if the condition fails to improve as anticipated.  I provided 10  minutes (10:44 AM - 10:54 AM) of non-face-to-face time during this telephone encounter.  I provided these services from the [facility] office.   Lequita Asal, MD, PhD    05/22/2019, 10:50 AM  I, Steven Nielsen, am acting as Education administrator for [place] Nielsen. Mike Gip, MD, PhD.   I, Melissa C. Mike Gip, MD, have reviewed the above documentation for accuracy and completeness, and I agree with the above.

## 2019-05-21 NOTE — Progress Notes (Signed)
Patient states that he is having some heart issues and is being followed by cardiologist. He is waiting on results of EKG and stress test performed today. He is weaning down his blood pressure medication.

## 2019-05-22 ENCOUNTER — Encounter: Payer: Self-pay | Admitting: Hematology and Oncology

## 2019-05-22 ENCOUNTER — Inpatient Hospital Stay (HOSPITAL_BASED_OUTPATIENT_CLINIC_OR_DEPARTMENT_OTHER): Payer: Managed Care, Other (non HMO) | Admitting: Hematology and Oncology

## 2019-05-22 DIAGNOSIS — D72829 Elevated white blood cell count, unspecified: Secondary | ICD-10-CM

## 2019-05-22 DIAGNOSIS — R000 Tachycardia, unspecified: Secondary | ICD-10-CM | POA: Insufficient documentation

## 2019-05-27 LAB — BCR-ABL1 FISH
Cells Analyzed: 200
Cells Counted: 200

## 2019-07-23 ENCOUNTER — Other Ambulatory Visit: Payer: Self-pay

## 2019-07-23 ENCOUNTER — Encounter: Payer: Self-pay | Admitting: Urology

## 2019-07-23 ENCOUNTER — Ambulatory Visit (INDEPENDENT_AMBULATORY_CARE_PROVIDER_SITE_OTHER): Payer: Managed Care, Other (non HMO) | Admitting: Urology

## 2019-07-23 VITALS — BP 145/91 | HR 88 | Ht 73.0 in | Wt 269.0 lb

## 2019-07-23 DIAGNOSIS — R310 Gross hematuria: Secondary | ICD-10-CM

## 2019-07-23 LAB — URINALYSIS, COMPLETE
Bilirubin, UA: NEGATIVE
Ketones, UA: NEGATIVE
Leukocytes,UA: NEGATIVE
Nitrite, UA: NEGATIVE
Protein,UA: NEGATIVE
RBC, UA: NEGATIVE
Specific Gravity, UA: 1.02 (ref 1.005–1.030)
Urobilinogen, Ur: 0.2 mg/dL (ref 0.2–1.0)
pH, UA: 6 (ref 5.0–7.5)

## 2019-07-23 LAB — MICROSCOPIC EXAMINATION
Bacteria, UA: NONE SEEN
Epithelial Cells (non renal): NONE SEEN /hpf (ref 0–10)
RBC, Urine: NONE SEEN /hpf (ref 0–2)

## 2019-07-23 NOTE — Patient Instructions (Signed)
Cystoscopy Cystoscopy is a procedure that is used to help diagnose and sometimes treat conditions that affect the lower urinary tract. The lower urinary tract includes the bladder and the urethra. The urethra is the tube that drains urine from the bladder. Cystoscopy is done using a thin, tube-shaped instrument with a light and camera at the end (cystoscope). The cystoscope may be hard or flexible, depending on the goal of the procedure. The cystoscope is inserted through the urethra, into the bladder. Cystoscopy may be recommended if you have:  Urinary tract infections that keep coming back.  Blood in the urine (hematuria).  An inability to control when you urinate (urinary incontinence) or an overactive bladder.  Unusual cells found in a urine sample.  A blockage in the urethra, such as a urinary stone.  Painful urination.  An abnormality in the bladder found during an intravenous pyelogram (IVP) or CT scan. Cystoscopy may also be done to remove a sample of tissue to be examined under a microscope (biopsy). What are the risks? Generally, this is a safe procedure. However, problems may occur, including:  Infection.  Bleeding.  What happens during the procedure?  1. You will be given one or more of the following: ? A medicine to numb the area (local anesthetic). 2. The area around the opening of your urethra will be cleaned. 3. The cystoscope will be passed through your urethra into your bladder. 4. Germ-free (sterile) fluid will flow through the cystoscope to fill your bladder. The fluid will stretch your bladder so that your health care provider can clearly examine your bladder walls. 5. Your doctor will look at the urethra and bladder. 6. The cystoscope will be removed The procedure may vary among health care providers  What can I expect after the procedure? After the procedure, it is common to have: 1. Some soreness or pain in your abdomen and urethra. 2. Urinary symptoms.  These include: ? Mild pain or burning when you urinate. Pain should stop within a few minutes after you urinate. This may last for up to 1 week. ? A small amount of blood in your urine for several days. ? Feeling like you need to urinate but producing only a small amount of urine. Follow these instructions at home: General instructions  Return to your normal activities as told by your health care provider.   Do not drive for 24 hours if you were given a sedative during your procedure.  Watch for any blood in your urine. If the amount of blood in your urine increases, call your health care provider.  If a tissue sample was removed for testing (biopsy) during your procedure, it is up to you to get your test results. Ask your health care provider, or the department that is doing the test, when your results will be ready.  Drink enough fluid to keep your urine pale yellow.  Keep all follow-up visits as told by your health care provider. This is important. Contact a health care provider if you:  Have pain that gets worse or does not get better with medicine, especially pain when you urinate.  Have trouble urinating.  Have more blood in your urine. Get help right away if you:  Have blood clots in your urine.  Have abdominal pain.  Have a fever or chills.  Are unable to urinate. Summary  Cystoscopy is a procedure that is used to help diagnose and sometimes treat conditions that affect the lower urinary tract.  Cystoscopy is done using   a thin, tube-shaped instrument with a light and camera at the end.  After the procedure, it is common to have some soreness or pain in your abdomen and urethra.  Watch for any blood in your urine. If the amount of blood in your urine increases, call your health care provider.  If you were prescribed an antibiotic medicine, take it as told by your health care provider. Do not stop taking the antibiotic even if you start to feel better. This  information is not intended to replace advice given to you by your health care provider. Make sure you discuss any questions you have with your health care provider. Document Revised: 01/21/2018 Document Reviewed: 01/21/2018 Elsevier Patient Education  2020 Elsevier Inc.   

## 2019-07-23 NOTE — Progress Notes (Signed)
   07/23/19 1:05 PM   Steven Nielsen [birth year] [id number]  CC: Gross hematuria  HPI: I saw Steven Nielsen in urology clinic today for evaluation of gross hematuria.  He is a relatively healthy 59 year old male who noted significant amount of blood in his underwear after waking up a week ago.  He was sexually active with his wife the night before, but denies any trauma or pain.  He denies any urinary symptoms at baseline, and he has never seen any blood in the urine.  He denies any hematospermia.  He was seen by his PCP and dipstick urinalysis was benign.  He has a 5-pack-year smoking history and quit 10 years ago.  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  He also reports he underwent a prostate biopsy around 10 years ago for a nodule that was negative. Recent PSA 0.68 07/2019.  Urinalysis benign today with 0-5 WBCs, 0 RBCs, no bacteria, no yeast, nitrite negative, no leukocytes.  PMH: Past Medical History:  Diagnosis Date  . Diabetes mellitus without complication (HCC)   . Hypertension     Surgical History: Past Surgical History:  Procedure Laterality Date  . SHOULDER ARTHROSCOPY      Family History: Family History  Problem Relation Age of Onset  . Hyperlipidemia Mother   . Stroke Father   . Hyperlipidemia Father   . Diabetes Father   . Cancer Father   . Hyperlipidemia Sister   . Diabetes Sister   . Cancer Paternal Uncle        colon  . Stroke Paternal Grandmother   . Hypertension Paternal Grandfather   . Diabetes Paternal Grandfather     Physical Exam: BP (!) 145/91   Pulse 88   Ht 6\' 1"  (1.854 m)   Wt 269 lb (122 kg)   BMI 35.49 kg/m    Constitutional:  Alert and oriented, No acute distress. Cardiovascular: No clubbing, cyanosis, or edema. Respiratory: Normal respiratory effort, no increased work of breathing. GI: Abdomen is soft, nontender, nondistended, no abdominal masses GU: Patent meatus, no penile lesions, testicles 20 cc and  descended bilaterally, left epididymal cyst DRE: 30 g, smooth, no nodules or masses Lymph: No cervical or inguinal lymphadenopathy. Skin: No rashes, bruises or suspicious lesions. Neurologic: Grossly intact, no focal deficits, moving all 4 extremities. Psychiatric: Normal mood and affect.  Laboratory Data: Reviewed  Pertinent Imaging: None to review  Assessment & Plan:   In summary, he is a 59 year old male who had significant amount of blood in the front of his underwear about a week ago.  He denies any trauma or inciting events, aside from sexual activity in April for with his wife.  He denies any hematospermia or recurrent gross hematuria.  We reviewed the AUA guidelines regarding gross hematuria and I did recommend further evaluation with his otherwise good health and history of carcinogenic exposures.  We discussed common possible etiologies of hematuria including BPH, malignancy, urolithiasis, medical renal disease, and idiopathic. Standard workup recommended by the AUA includes imaging with CT urogram to assess the upper tracts, and cystoscopy. Cytology is performed on patient's with gross hematuria to look for malignant cells in the urine.  CT urogram and cystoscopy  May, MD 07/23/2019  [facility] [address], [address] [place], [place] [phone]

## 2019-07-23 NOTE — Addendum Note (Signed)
Addended by: Frankey Shown on: 07/23/2019 01:42 PM   Modules accepted: Orders

## 2019-08-10 ENCOUNTER — Ambulatory Visit
Admission: RE | Admit: 2019-08-10 | Discharge: 2019-08-10 | Disposition: A | Discharge status: Home / Self Care | Payer: Managed Care, Other (non HMO) | Source: Ambulatory Visit | Attending: Urology | Admitting: Urology

## 2019-08-10 ENCOUNTER — Other Ambulatory Visit: Payer: Self-pay

## 2019-08-10 DIAGNOSIS — R310 Gross hematuria: Secondary | ICD-10-CM | POA: Insufficient documentation

## 2019-08-10 MED ORDER — IOHEXOL 300 MG/ML  SOLN
125.0000 mL | Freq: Once | INTRAMUSCULAR | Status: AC | PRN
  Administered 2019-08-10: 125 mL via INTRAVENOUS

## 2019-08-13 ENCOUNTER — Other Ambulatory Visit: Payer: Self-pay

## 2019-08-13 ENCOUNTER — Encounter: Payer: Self-pay | Admitting: Urology

## 2019-08-13 ENCOUNTER — Ambulatory Visit (INDEPENDENT_AMBULATORY_CARE_PROVIDER_SITE_OTHER): Payer: Managed Care, Other (non HMO) | Admitting: Urology

## 2019-08-13 VITALS — BP 152/89 | HR 87 | Ht 73.0 in | Wt 273.0 lb

## 2019-08-13 DIAGNOSIS — R310 Gross hematuria: Secondary | ICD-10-CM | POA: Diagnosis not present

## 2019-08-13 NOTE — Progress Notes (Signed)
Cystoscopy Procedure Note:  Indication: Hematuria  After informed consent and discussion of the procedure and its risks, Steven Nielsen was positioned and prepped in the standard fashion. Cystoscopy was performed with a flexible cystoscope. The urethra, bladder neck and entire bladder was visualized in a standard fashion. The prostate was moderate in size. The ureteral orifices were visualized in their normal location and orientation.  Bladder was grossly normal with no abnormalities.  No abnormalities on retroflexion  Imaging: CT urogram personally reviewed.  Punctate nonobstructing right lower pole stone, no other filling defects or urologic abnormalities.  Findings: Normal cystoscopy  Assessment and Plan: Follow-up as needed, recommended PCP follow-up regarding lumbar impingement seen on CT, as well as 0.7 cm sclerotic lesion in the acetabular wall.  Recent PSA is normal, and DRE also normal.  Legrand Rams, MD 08/13/2019

## 2019-08-14 LAB — URINALYSIS, COMPLETE
Bilirubin, UA: NEGATIVE
Glucose, UA: NEGATIVE
Ketones, UA: NEGATIVE
Leukocytes,UA: NEGATIVE
Nitrite, UA: NEGATIVE
Protein,UA: NEGATIVE
RBC, UA: NEGATIVE
Specific Gravity, UA: 1.02 (ref 1.005–1.030)
Urobilinogen, Ur: 0.2 mg/dL (ref 0.2–1.0)
pH, UA: 6 (ref 5.0–7.5)

## 2019-08-14 LAB — MICROSCOPIC EXAMINATION
Bacteria, UA: NONE SEEN
Epithelial Cells (non renal): NONE SEEN /hpf (ref 0–10)
RBC, Urine: NONE SEEN /hpf (ref 0–2)

## 2019-09-04 ENCOUNTER — Other Ambulatory Visit: Payer: Self-pay | Admitting: Nurse Practitioner

## 2019-09-04 DIAGNOSIS — M5416 Radiculopathy, lumbar region: Secondary | ICD-10-CM

## 2019-09-04 DIAGNOSIS — M542 Cervicalgia: Secondary | ICD-10-CM

## 2019-09-04 DIAGNOSIS — M5442 Lumbago with sciatica, left side: Secondary | ICD-10-CM

## 2019-09-04 DIAGNOSIS — G8929 Other chronic pain: Secondary | ICD-10-CM

## 2019-09-08 DIAGNOSIS — M542 Cervicalgia: Secondary | ICD-10-CM | POA: Insufficient documentation

## 2019-09-08 DIAGNOSIS — G8929 Other chronic pain: Secondary | ICD-10-CM | POA: Insufficient documentation

## 2019-09-08 DIAGNOSIS — M5412 Radiculopathy, cervical region: Secondary | ICD-10-CM | POA: Insufficient documentation

## 2019-09-20 ENCOUNTER — Ambulatory Visit: Payer: Managed Care, Other (non HMO)

## 2019-10-04 ENCOUNTER — Ambulatory Visit: Payer: Managed Care, Other (non HMO)

## 2019-10-04 ENCOUNTER — Ambulatory Visit
Admission: RE | Admit: 2019-10-04 | Discharge: 2019-10-04 | Disposition: A | Discharge status: Home / Self Care | Payer: Managed Care, Other (non HMO) | Source: Ambulatory Visit | Attending: Nurse Practitioner | Admitting: Nurse Practitioner

## 2019-10-04 ENCOUNTER — Other Ambulatory Visit: Payer: Self-pay

## 2019-10-04 DIAGNOSIS — M542 Cervicalgia: Secondary | ICD-10-CM | POA: Insufficient documentation

## 2019-10-04 DIAGNOSIS — G8929 Other chronic pain: Secondary | ICD-10-CM | POA: Diagnosis present

## 2019-10-04 DIAGNOSIS — M5416 Radiculopathy, lumbar region: Secondary | ICD-10-CM | POA: Insufficient documentation

## 2019-10-04 DIAGNOSIS — M5442 Lumbago with sciatica, left side: Secondary | ICD-10-CM | POA: Diagnosis present

## 2019-10-08 DIAGNOSIS — M4802 Spinal stenosis, cervical region: Secondary | ICD-10-CM | POA: Insufficient documentation

## 2019-10-08 DIAGNOSIS — M48061 Spinal stenosis, lumbar region without neurogenic claudication: Secondary | ICD-10-CM | POA: Insufficient documentation

## 2019-11-17 ENCOUNTER — Other Ambulatory Visit: Payer: Self-pay

## 2019-11-17 ENCOUNTER — Ambulatory Visit
Admission: RE | Admit: 2019-11-17 | Discharge: 2019-11-17 | Disposition: A | Discharge status: Home / Self Care | Payer: Managed Care, Other (non HMO) | Source: Ambulatory Visit | Attending: Internal Medicine | Admitting: Internal Medicine

## 2019-11-17 VITALS — BP 144/74 | HR 71 | Temp 98.4°F | Resp 18 | Ht 73.0 in | Wt 265.0 lb

## 2019-11-17 DIAGNOSIS — L089 Local infection of the skin and subcutaneous tissue, unspecified: Secondary | ICD-10-CM

## 2019-11-17 DIAGNOSIS — G620 Drug-induced polyneuropathy: Secondary | ICD-10-CM | POA: Insufficient documentation

## 2019-11-17 DIAGNOSIS — E11628 Type 2 diabetes mellitus with other skin complications: Secondary | ICD-10-CM | POA: Diagnosis not present

## 2019-11-17 MED ORDER — AMOXICILLIN-POT CLAVULANATE 875-125 MG PO TABS
1.0000 | ORAL_TABLET | Freq: Two times a day (BID) | ORAL | 0 refills | Status: DC
Start: 2019-11-17 — End: 2019-11-18

## 2019-11-17 NOTE — ED Triage Notes (Signed)
Pt with left foot small "cut" on bottom of left great toe. Hx of neuropathy. Now with left foot swelling mostly in heel and great toe. Blister noted to base of toe filled with serous fluid

## 2019-11-17 NOTE — Discharge Instructions (Signed)
Please take antibiotics as directed Follow-up with podiatrist for foot care Return precautions given

## 2019-11-18 ENCOUNTER — Ambulatory Visit (INDEPENDENT_AMBULATORY_CARE_PROVIDER_SITE_OTHER): Payer: Managed Care, Other (non HMO) | Admitting: Podiatry

## 2019-11-18 ENCOUNTER — Encounter: Payer: Self-pay | Admitting: Podiatry

## 2019-11-18 ENCOUNTER — Other Ambulatory Visit: Payer: Self-pay

## 2019-11-18 ENCOUNTER — Telehealth: Payer: Self-pay | Admitting: Internal Medicine

## 2019-11-18 DIAGNOSIS — L03116 Cellulitis of left lower limb: Secondary | ICD-10-CM

## 2019-11-18 DIAGNOSIS — E1142 Type 2 diabetes mellitus with diabetic polyneuropathy: Secondary | ICD-10-CM

## 2019-11-18 DIAGNOSIS — L97522 Non-pressure chronic ulcer of other part of left foot with fat layer exposed: Secondary | ICD-10-CM | POA: Diagnosis not present

## 2019-11-18 DIAGNOSIS — M205X2 Other deformities of toe(s) (acquired), left foot: Secondary | ICD-10-CM | POA: Diagnosis not present

## 2019-11-18 MED ORDER — AMOXICILLIN-POT CLAVULANATE 875-125 MG PO TABS: 1.0000 | ORAL_TABLET | Freq: Two times a day (BID) | ORAL | 0 refills | Status: AC

## 2019-11-18 MED ORDER — MUPIROCIN 2 % EX OINT
1.0000 | TOPICAL_OINTMENT | Freq: Two times a day (BID) | CUTANEOUS | 2 refills | Status: DC
Start: 2019-11-18 — End: 2020-07-06

## 2019-11-18 NOTE — Progress Notes (Signed)
  Subjective:  Patient ID: Steven Nielsen, male    DOB: Aug 08, 1960,  MRN: [id number]  Chief Complaint  Patient presents with  . Diabetic Ulcer    Patient presents today for callous/dm ulcer to left hallux.  he noticed 2 days ago and was seen in ED yesterday.  He says it was really red, swollen, they gave him amoxicillin for treatement.    59 y.o. male presents with the above complaint. History confirmed with patient.  He noticed it first 2 days ago and was seen in the [facility] emergency room.  They put him on Augmentin which he began this morning.  Has been cleaning it at home with his wife.  Objective:  Physical Exam: warm, good capillary refill, no trophic changes or ulcerative lesions, normal DP and PT pulses and abnormal monofilament exam with LOPS. Left Foot: Full-thickness ulceration of subcutaneous tissue of the plantar left hallux post debridement measures approximately 1 cm x 1 cm x 0.3 cm, granular wound base, significant hyperkeratosis above this, mild erythema and edema of the hallux, no purulence is found.  Does not track to bone or tendon     Assessment:   1. Skin ulcer of toe of left foot with fat layer exposed (HCC)   2. Type 2 diabetes mellitus with polyneuropathy (HCC)   3. Hallux limitus of left foot   4. Cellulitis of foot, left      Plan:  Patient was evaluated and treated and all questions answered.  Patient educated on diabetes. Discussed proper diabetic foot care and discussed risks and complications of disease. Educated patient in depth on reasons to return to the office immediately should he/she discover anything concerning or new on the feet. All questions answered. Discussed proper shoes as well.   Ulcer left hallux -Debridement as below. -Dressed with Iodosorb, DSD. -Begin off-loading with surgical shoe.  This was dispensed today -His Rx for Augmentin was initially for 7 days, would like him to continue this for additional 7 days and this was  sent to his pharmacy -Rx for mupirocin sent to his pharmacy he will apply this daily with a clean bandage after bathing.  No soaking.    Procedure: Excisional Debridement of Wound Rationale: Removal of non-viable soft tissue from the wound to promote healing.  Anesthesia: none Pre-Debridement Wound Measurements: 0.2 cm x 0.3 cm x 0.3 cm Post-Debridement Wound Measurements: 1.0 cm x 1.0 cm x 0.3 cm  Type of Debridement: Sharp Excisional Tissue Removed: Non-viable soft tissue Depth of Debridement: subcutaneous tissue. Technique: Sharp excisional debridement to bleeding, viable wound base.  Dressing: Dry, sterile, compression dressing. Disposition: Patient tolerated procedure well.

## 2019-11-18 NOTE — Patient Instructions (Signed)
Change the bandage daily with the mupirocin or bactroban. Do not soak the foot in water, it is OK to bathe and immediately apply a clean bandage.  Monitor for any signs/symptoms of infection. Signs of an infection could be redness beyond the site of the incision/procedure/wound, foul smelling odor, drainage that is thick and yellow or green, or severe swelling and pain. Call the office immediately if any occur or go directly to the emergency room. Call with any questions/concerns.

## 2019-11-18 NOTE — ED Provider Notes (Signed)
[facility]    CSN: [id number] Arrival date & time: 11/17/19  1242      History   Chief Complaint Chief Complaint  Patient presents with  . Appointment  . Cellulitis  . Foot Swelling    HPI Steven Nielsen is a 59 y.o. male with a history of diabetes mellitus type 2 comes to the urgent care with complaints of painful swelling on the plantar surface of the left great toe.  Patient has diabetic neuropathy with a callus on the left great toe.  He has noticed worsening swelling in the left great toe which seems to be spreading up his foot.  He has noticed some serous discharge over the past few days.  No fever.  He has a podiatrist but has not seen the podiatrist recently.  Blood sugars this morning was over 200  HPI  Past Medical History:  Diagnosis Date  . Diabetes mellitus without complication (HCC)   . Hypertension     Patient Active Problem List   Diagnosis Date Noted  . 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 Foraminal stenosis of cervical region 10/08/2019  . Lumbar stenosis without neurogenic claudication 10/08/2019  . Cervical radiculitis 09/08/2019  . Chronic bilateral low back pain with left-sided sciatica 09/08/2019  . Neck pain 09/08/2019  . Tachycardia 05/22/2019  . Leukocytosis 05/15/2019  . Elevated triglycerides with high cholesterol 03/16/2019  . Diabetes mellitus without complication (HCC) 12/11/2018  . RLS (restless legs syndrome) 12/05/2016  . Obesity (BMI 35.0-39.9 without comorbidity) 03/13/2016  . Obstructive sleep apnea on CPAP 03/13/2016  . Migraine with vertigo 03/02/2016  . Androgen deficiency 03/08/2013  . 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 DDD (degenerative disc disease), lumbar 03/05/2013  . Essential (primary) hypertension 03/05/2013    Past Surgical History:  Procedure Laterality Date  . SHOULDER ARTHROSCOPY         Home Medications    Prior to Admission medications   Medication  Sig Start Date End Date Taking? Authorizing Provider  Acetaminophen (TYLENOL 8 HOUR ARTHRITIS PAIN PO)     [provider]  amLODipine (NORVASC) 5 MG tablet Take 5 mg by mouth daily. 07/02/19   [provider]  amoxicillin-clavulanate (AUGMENTIN) 875-125 MG tablet Take 1 tablet by mouth every 12 (twelve) hours for 7 days. 11/24/19 12/01/19  McDonald, Rachelle Hora, DPM  ascorbic acid (VITAMIN C) 500 MG tablet Take by mouth.    [provider]  baclofen (LIORESAL) 10 MG tablet Take 10 mg by mouth 2 (two) times daily. 02/11/19   [provider]  butalbital-acetaminophen-caffeine (FIORICET) 50-325-40 MG tablet Take by mouth. 01/28/18   [provider]  Cholecalciferol 125 MCG (5000 UT) TABS Take by mouth.    [provider]  DULoxetine (CYMBALTA) 30 MG capsule TAKE 1 CAPSULE(30 MG) BY MOUTH EVERY DAY 01/07/19   [provider]  gabapentin (NEURONTIN) 300 MG capsule Take 300 mg by mouth daily. 04/20/19   [provider]  glucose blood (AGAMATRIX PRESTO TEST) test strip Use 1 each (1 strip total) once daily Use as instructed. 12/12/18 12/12/19  [provider]  losartan (COZAAR) 100 MG tablet Take 100 mg by mouth daily. 05/09/19   [provider]  metFORMIN (GLUCOPHAGE-XR) 750 MG 24 hr tablet Take 750 mg by mouth daily. 05/04/19   [provider]  mupirocin ointment (BACTROBAN) 2 % Apply 1 application topically 2 (two) times daily. 11/18/19   Edwin Cap, DPM  Omega-3 Fatty Acids (FISH  OIL) 1000 MG CAPS Take 1,000 mg by mouth daily.    [provider]  Omeprazole 20 MG TBDD     [provider]  Oxymetazoline HCl (NASAL SPRAY) 0.05 % SOLN Place into the nose.    [provider]  zolmitriptan (ZOMIG) 5 MG tablet 5 mg once as needed 11/29/17   [provider]    Family History Family History  Problem Relation Age of Onset  . Hyperlipidemia Mother   . Stroke Father   .  Hyperlipidemia Father   . Diabetes Father   . Cancer Father   . Hyperlipidemia Sister   . Diabetes Sister   . Cancer Paternal Uncle        colon  . Stroke Paternal Grandmother   . Hypertension Paternal Grandfather   . Diabetes Paternal Grandfather     Social History Social History   Tobacco Use  . Smoking status: Never Smoker  . Smokeless tobacco: Never Used  Vaping Use  . Vaping Use: Never used  Substance Use Topics  . Alcohol use: Not Currently  . Drug use: Never     Allergies   Sumatriptan   Review of Systems Review of Systems  Constitutional: Negative for chills and fever.  Respiratory: Negative.   Gastrointestinal: Negative.   Genitourinary: Negative.   Musculoskeletal: Positive for myalgias. Negative for arthralgias and joint swelling.  Skin: Positive for color change.  Neurological: Negative.  Negative for dizziness and light-headedness.     Physical Exam Triage Vital Signs ED Triage Vitals  Enc Vitals Group     BP 11/17/19 1256 (!) 144/74     Pulse Rate 11/17/19 1256 71     Resp 11/17/19 1256 18     Temp 11/17/19 1256 98.4 F (36.9 C)     Temp Source 11/17/19 1256 Oral     SpO2 11/17/19 1256 100 %     Weight 11/17/19 1253 265 lb (120.2 kg)     Height 11/17/19 1253 6\' 1"  (1.854 m)     Head Circumference --      Peak Flow --      Pain Score 11/17/19 1253 3     Pain Loc --      Pain Edu? --      Excl. in GC? --    No data found.  Updated Vital Signs BP (!) 144/74 (BP Location: Right Arm)   Pulse 71   Temp 98.4 F (36.9 C) (Oral)   Resp 18   Ht 6\' 1"  (1.854 m)   Wt 120.2 kg   SpO2 100%   BMI 34.96 kg/m   Visual Acuity Right Eye Distance:   Left Eye Distance:   Bilateral Distance:    Right Eye Near:   Left Eye Near:    Bilateral Near:     Physical Exam Vitals and nursing note reviewed.  Constitutional:      General: He is not in acute distress.    Appearance: He is not ill-appearing.  Musculoskeletal:        General: Normal  range of motion.     Comments: Callus on the plantar aspect of the left great toe.  Callus has been incised in the middle.  Seems to be some fluid collection underneath the callus.  No significant erythema.  Neurological:     Mental Status: He is alert.      UC Treatments / Results  Labs (all labs ordered are listed, but only abnormal results are displayed) Labs Reviewed -  No data to display  EKG   Radiology No results found.  Procedures Procedures (including critical care time)  Medications Ordered in UC Medications - No data to display  Initial Impression / Assessment and Plan / UC Course  I have reviewed the triage vital signs and the nursing notes.  Pertinent labs & imaging results that were available during my care of the patient were reviewed by me and considered in my medical decision making (see chart for details).     1.  Diabetic foot infection: Augmentin twice daily for 7 days Patient is advised to follow-up with podiatrist for further management Better blood glucose control advised Return precautions given Final Clinical Impressions(s) / UC Diagnoses   Final diagnoses:  Type 2 diabetes mellitus with diabetic foot infection [facility])     Discharge Instructions     Please take antibiotics as directed Follow-up with podiatrist for foot care Return precautions given   ED Prescriptions    Medication Sig Dispense Auth. Provider   amoxicillin-clavulanate (AUGMENTIN) 875-125 MG tablet Take 1 tablet by mouth every 12 (twelve) hours. 14 tablet Melodye Swor, Britta Mccreedy, MD     PDMP not reviewed this encounter.   Merrilee Jansky, MD 11/18/19 1359

## 2019-12-02 ENCOUNTER — Encounter: Payer: Self-pay | Admitting: Podiatry

## 2019-12-02 ENCOUNTER — Ambulatory Visit (INDEPENDENT_AMBULATORY_CARE_PROVIDER_SITE_OTHER): Payer: Managed Care, Other (non HMO) | Admitting: Podiatry

## 2019-12-02 ENCOUNTER — Other Ambulatory Visit: Payer: Self-pay

## 2019-12-02 DIAGNOSIS — E1142 Type 2 diabetes mellitus with diabetic polyneuropathy: Secondary | ICD-10-CM | POA: Diagnosis not present

## 2019-12-02 DIAGNOSIS — M205X2 Other deformities of toe(s) (acquired), left foot: Secondary | ICD-10-CM

## 2019-12-02 DIAGNOSIS — E1165 Type 2 diabetes mellitus with hyperglycemia: Secondary | ICD-10-CM | POA: Diagnosis not present

## 2019-12-02 DIAGNOSIS — L97522 Non-pressure chronic ulcer of other part of left foot with fat layer exposed: Secondary | ICD-10-CM

## 2019-12-02 NOTE — Progress Notes (Signed)
  Subjective:  Patient ID: Steven Nielsen, male    DOB: 1960/10/02,  MRN: [id number]  Chief Complaint  Patient presents with  . Foot Ulcer    "its getting better"    59 y.o. male returns with the above complaint. History confirmed with patient.  Here with his wife today.  He notes significant improvement.  Has been applying mupirocin and a bandage.  He stopped taking the Augmentin secondary to GI upset  Objective:  Physical Exam: warm, good capillary refill, no trophic changes or ulcerative lesions, normal DP and PT pulses and abnormal monofilament exam with LOPS. Left Foot: Full-thickness ulceration of subcutaneous tissue of the plantar left hallux, with moderate hyperkeratosis around the wound with significant improvement, measurements today post debridement measure 0.5 cm x 0.6 centimeters by 0.1 cm.  No erythema, edema, mild serous drainage and no purulence or malodor.         Assessment:   1. Skin ulcer of toe of left foot with fat layer exposed (HCC)   2. Type 2 diabetes mellitus with polyneuropathy (HCC)   3. Hallux limitus of left foot      Plan:  Patient was evaluated and treated and all questions answered.  Patient educated on diabetes. Discussed proper diabetic foot care and discussed risks and complications of disease. Educated patient in depth on reasons to return to the office immediately should he/she discover anything concerning or new on the feet. All questions answered. Discussed proper shoes as well.   Ulcer left hallux -Debridement as below.  Significantly improved with approximately 50% healing, hopeful to have this healed up in a few weeks -Dressed with Iodosorb, DSD, light Coban compression. -Continue offloading -Antibiotics were stopped early due to adverse effects, no need for further antibiotics today -Continue current local wound care plan with mupirocin.  He had a small blister on the medial second toe from the bandage rubbing, he can apply ice  mupirocin to this as well.   Procedure: Excisional Debridement of Wound Rationale: Removal of non-viable soft tissue from the wound to promote healing.  Anesthesia: none Pre-Debridement Wound Measurements: 0.5 x 0.6 x 0.1 cm Post-Debridement Wound Measurements: Same as predebridement Type of Debridement: Sharp selective Tissue Removed: Non-viable soft tissue Depth of Debridement: subcutaneous tissue. Technique: Sharp selective debridement to bleeding, viable wound base.  Dressing: Dry, sterile, compression dressing. Disposition: Patient tolerated procedure well.  Return in 3 weeks for reevaluation

## 2019-12-23 ENCOUNTER — Encounter: Payer: Self-pay | Admitting: Podiatry

## 2019-12-23 ENCOUNTER — Ambulatory Visit (INDEPENDENT_AMBULATORY_CARE_PROVIDER_SITE_OTHER): Payer: Managed Care, Other (non HMO) | Admitting: Podiatry

## 2019-12-23 ENCOUNTER — Other Ambulatory Visit: Payer: Self-pay

## 2019-12-23 DIAGNOSIS — E1142 Type 2 diabetes mellitus with diabetic polyneuropathy: Secondary | ICD-10-CM

## 2019-12-23 DIAGNOSIS — L97522 Non-pressure chronic ulcer of other part of left foot with fat layer exposed: Secondary | ICD-10-CM | POA: Diagnosis not present

## 2019-12-23 DIAGNOSIS — E1165 Type 2 diabetes mellitus with hyperglycemia: Secondary | ICD-10-CM | POA: Diagnosis not present

## 2019-12-23 DIAGNOSIS — M205X2 Other deformities of toe(s) (acquired), left foot: Secondary | ICD-10-CM

## 2019-12-23 NOTE — Progress Notes (Signed)
  Subjective:  Patient ID: Steven Nielsen, male    DOB: April 10, 1960,  MRN: [id number]  Chief Complaint  Patient presents with  . Foot Ulcer    "its looking better"    59 y.o. male returns with the above complaint. History confirmed with patient.   Continues use of mupirocin at home.  Objective:  Physical Exam: warm, good capillary refill, no trophic changes or ulcerative lesions, normal DP and PT pulses and abnormal monofilament exam with LOPS. Left Foot: Full-thickness ulceration of subcutaneous tissue of the plantar left hallux, with moderate hyperkeratosis around the wound with significant improvement, measurements today post debridement measure 0.5 cm x 0.4 centimeters by 0.1 cm.  No erythema, edema, mild serous drainage and no purulence or malodor.           Assessment:   1. Skin ulcer of toe of left foot with fat layer exposed (HCC)   2. Type 2 diabetes mellitus with polyneuropathy (HCC)   3. Hallux limitus of left foot   4. Uncontrolled type 2 diabetes mellitus with hyperglycemia (HCC)      Plan:  Patient was evaluated and treated and all questions answered.  Patient educated on diabetes. Discussed proper diabetic foot care and discussed risks and complications of disease. Educated patient in depth on reasons to return to the office immediately should he/she discover anything concerning or new on the feet. All questions answered. Discussed proper shoes as well.   Ulcer left hallux -Debridement as below.  Not as much improvement this week but continues to improve slowly -Dressed with Iodosorb, DSD, light Coban compression. -Continue offloading, today I dispensed a silicone toe crest to keep pressure proximal to the wound bed. -Continue current local wound care plan with mupirocin.  He had a small blister on the medial second toe from the bandage rubbing, he can apply ice mupirocin to this as well.   Procedure: Excisional Debridement of Wound Rationale: Removal of  non-viable soft tissue from the wound to promote healing.  Anesthesia: none Pre-Debridement Wound Measurements: 0.5 x 0.6 x 0.1 cm Post-Debridement Wound Measurements: Same as predebridement Type of Debridement: Sharp selective Tissue Removed: Non-viable soft tissue Depth of Debridement: subcutaneous tissue. Technique: Sharp selective debridement to bleeding, viable wound base.  Dressing: Dry, sterile, compression dressing. Disposition: Patient tolerated procedure well.  Return in 3 weeks for reevaluation

## 2020-01-13 ENCOUNTER — Encounter: Payer: Self-pay | Admitting: Podiatry

## 2020-01-13 ENCOUNTER — Other Ambulatory Visit: Payer: Self-pay

## 2020-01-13 ENCOUNTER — Ambulatory Visit (INDEPENDENT_AMBULATORY_CARE_PROVIDER_SITE_OTHER): Payer: Managed Care, Other (non HMO) | Admitting: Podiatry

## 2020-01-13 DIAGNOSIS — L97522 Non-pressure chronic ulcer of other part of left foot with fat layer exposed: Secondary | ICD-10-CM | POA: Diagnosis not present

## 2020-01-13 NOTE — Progress Notes (Signed)
  Subjective:  Patient ID: Steven Nielsen, male    DOB: January 12, 1961,  MRN: [id number]  Chief Complaint  Patient presents with  . Foot Ulcer    "its getting better.  Looks like its closing up"    59 y.o. male returns with the above complaint. History confirmed with patient.   Continues use of mupirocin at home.  Objective:  Physical Exam: warm, good capillary refill, no trophic changes or ulcerative lesions, normal DP and PT pulses and abnormal monofilament exam with LOPS. Left Foot: Full-thickness ulceration of subcutaneous tissue of the plantar left hallux, with moderate hyperkeratosis around the wound with significant improvement, measurements today post debridement measure 0.9 cm x 0.4 centimeters by 0.1 cm.  No erythema, edema, mild serous drainage and no purulence or malodor.             Assessment:   1. Skin ulcer of toe of left foot with fat layer exposed (HCC)      Plan:  Patient was evaluated and treated and all questions answered.  Patient educated on diabetes. Discussed proper diabetic foot care and discussed risks and complications of disease. Educated patient in depth on reasons to return to the office immediately should he/she discover anything concerning or new on the feet. All questions answered. Discussed proper shoes as well.   Ulcer left hallux -Debridement as below.  Stagnated somewhat in size. Emphasized importance of offloading with silicone crest -Dressed with puracol collagen dressing, DSD, light Coban compression. Change to this at home q72h    Procedure: Excisional Debridement of Wound Rationale: Removal of non-viable soft tissue from the wound to promote healing.  Anesthesia: none Pre-Debridement Wound Measurements: 0.9 x 0.4 x 0.1 cm Post-Debridement Wound Measurements: Same as predebridement Type of Debridement: Sharp selective Tissue Removed: Non-viable soft tissue Depth of Debridement: subcutaneous tissue. Technique: Sharp selective  debridement to bleeding, viable wound base.  Dressing: Dry, sterile, compression dressing. Disposition: Patient tolerated procedure well.  Return in 2 weeks for reevaluation

## 2020-01-27 ENCOUNTER — Ambulatory Visit (INDEPENDENT_AMBULATORY_CARE_PROVIDER_SITE_OTHER): Payer: Managed Care, Other (non HMO) | Admitting: Podiatry

## 2020-01-27 ENCOUNTER — Other Ambulatory Visit: Payer: Self-pay

## 2020-01-27 DIAGNOSIS — L600 Ingrowing nail: Secondary | ICD-10-CM

## 2020-01-27 DIAGNOSIS — M205X2 Other deformities of toe(s) (acquired), left foot: Secondary | ICD-10-CM | POA: Diagnosis not present

## 2020-01-27 DIAGNOSIS — L97522 Non-pressure chronic ulcer of other part of left foot with fat layer exposed: Secondary | ICD-10-CM | POA: Diagnosis not present

## 2020-01-27 DIAGNOSIS — E1142 Type 2 diabetes mellitus with diabetic polyneuropathy: Secondary | ICD-10-CM

## 2020-01-27 NOTE — Progress Notes (Signed)
  Subjective:  Patient ID: Steven Nielsen, male    DOB: [birth year],  MRN: [id number]  Chief Complaint  Patient presents with  . Foot Ulcer    "I think its doing better"    59 y.o. male returns with the above complaint. History confirmed with patient.   Using the collagen dressings. Today also has ingrowing nail corners R hallux.  Objective:  Physical Exam: warm, good capillary refill, no trophic changes or ulcerative lesions, normal DP and PT pulses and abnormal monofilament exam with LOPS. Left Foot: Full-thickness ulceration of subcutaneous tissue of the plantar left hallux, with moderate hyperkeratosis around the wound with significant improvement, measurements today post debridement measure 0.6 cm x 0.3 centimeters by 0.1 cm.  No erythema, edema, mild serous drainage and no purulence or malodor.              Assessment:   No diagnosis found.   Plan:  Patient was evaluated and treated and all questions answered.  Patient educated on diabetes. Discussed proper diabetic foot care and discussed risks and complications of disease. Educated patient in depth on reasons to return to the office immediately should he/she discover anything concerning or new on the feet. All questions answered. Discussed proper shoes as well.   Ulcer left hallux -Debridement as below -Dressed with puracol collagen dressing, DSD, light Coban compression. Change this at home q72h   Procedure: Excisional Debridement of Wound Rationale: Removal of non-viable soft tissue from the wound to promote healing.  Anesthesia: none Pre-Debridement Wound Measurements: 0.9 x 0.4 x 0.1 cm Post-Debridement Wound Measurements: Same as predebridement Type of Debridement: Sharp selective Tissue Removed: Non-viable soft tissue Depth of Debridement: subcutaneous tissue. Technique: Sharp selective debridement to bleeding, viable wound base.  Dressing: Dry, sterile, compression dressing. Disposition: Patient  tolerated procedure well.  Return in 2 weeks for reevaluation    Hallux nail medial and lateral borders incurvated and debrided in slant back fashion on RLE

## 2020-02-10 ENCOUNTER — Other Ambulatory Visit: Payer: Self-pay

## 2020-02-10 ENCOUNTER — Ambulatory Visit (INDEPENDENT_AMBULATORY_CARE_PROVIDER_SITE_OTHER): Payer: Managed Care, Other (non HMO) | Admitting: Podiatry

## 2020-02-10 ENCOUNTER — Encounter: Payer: Self-pay | Admitting: Podiatry

## 2020-02-10 DIAGNOSIS — L97522 Non-pressure chronic ulcer of other part of left foot with fat layer exposed: Secondary | ICD-10-CM | POA: Diagnosis not present

## 2020-02-10 DIAGNOSIS — E1142 Type 2 diabetes mellitus with diabetic polyneuropathy: Secondary | ICD-10-CM

## 2020-02-10 DIAGNOSIS — M205X2 Other deformities of toe(s) (acquired), left foot: Secondary | ICD-10-CM

## 2020-02-10 NOTE — Progress Notes (Signed)
°  Subjective:  Patient ID: Steven Nielsen, male    DOB: 07/14/1960,  MRN: [id number]  Chief Complaint  Patient presents with   Foot Ulcer    "its looking better, but I have pain when pressure is applied"    59 y.o. male returns with the above complaint. History confirmed with patient.   Using the collagen dressings. Having some new pain with pressure when walking  Objective:  Physical Exam:   warm, good capillary refill, no trophic changes or ulcerative lesions, normal DP and PT pulses and abnormal monofilament exam with LOPS. Left Foot: Full-thickness ulceration of subcutaneous tissue of the plantar left hallux, with moderate hyperkeratosis around the wound with significant improvement, measurements today post debridement measure 0.6 cm x 0.3 centimeters by 0.1 cm.  No erythema, edema, mild serous drainage and no purulence or malodor.              Assessment:   1. Skin ulcer of toe of left foot with fat layer exposed (HCC)   2. Type 2 diabetes mellitus with polyneuropathy (HCC)   3. Hallux limitus of left foot      Plan:  Patient was evaluated and treated and all questions answered.  Patient educated on diabetes. Discussed proper diabetic foot care and discussed risks and complications of disease. Educated patient in depth on reasons to return to the office immediately should he/she discover anything concerning or new on the feet. All questions answered. Discussed proper shoes as well.   Ulcer left hallux -Debridement as below -Dressed with puracol collagen dressing, DSD, light Coban compression. Change this at home q72h   Procedure: Excisional Debridement of Wound Rationale: Removal of non-viable soft tissue from the wound to promote healing.  Anesthesia: none Pre-Debridement Wound Measurements: 0.4 x 0.3 x 0.1 cm Post-Debridement Wound Measurements: Same as predebridement Type of Debridement: Sharp selective Tissue Removed: Non-viable soft tissue Depth of  Debridement: subcutaneous tissue. Technique: Sharp selective debridement to bleeding, viable wound base.  Dressing: Dry, sterile, compression dressing. Disposition: Patient tolerated procedure well.  Return in 3 weeks for reevaluation

## 2020-03-02 ENCOUNTER — Ambulatory Visit: Payer: Managed Care, Other (non HMO) | Admitting: Podiatry

## 2020-03-02 ENCOUNTER — Telehealth: Payer: Self-pay

## 2020-03-02 NOTE — Telephone Encounter (Signed)
Patient's wife Elnita Maxwell called and stated that his toe is infected again.  She said its become red around toe, swollen and painful.  She also says the wound has become very dark colored.  Patient denies any fever.  He is scheduled to see Dr. Logan Bores this Friday, but not sure if they will come in due to weather.  She is wanting to know if he can get another antibiotic to cover him until he is seen.  Please advise

## 2020-03-03 MED ORDER — SULFAMETHOXAZOLE-TRIMETHOPRIM 800-160 MG PO TABS
1.0000 | ORAL_TABLET | Freq: Two times a day (BID) | ORAL | 0 refills | Status: DC
Start: 2020-03-03 — End: 2020-05-25

## 2020-03-03 NOTE — Telephone Encounter (Signed)
Patient's wife has been notified of the antibiotic sent to pharmacy

## 2020-03-04 ENCOUNTER — Other Ambulatory Visit: Payer: Self-pay

## 2020-03-04 ENCOUNTER — Ambulatory Visit (INDEPENDENT_AMBULATORY_CARE_PROVIDER_SITE_OTHER): Payer: Managed Care, Other (non HMO) | Admitting: Podiatry

## 2020-03-04 ENCOUNTER — Encounter: Payer: Self-pay | Admitting: Podiatry

## 2020-03-04 DIAGNOSIS — E0843 Diabetes mellitus due to underlying condition with diabetic autonomic (poly)neuropathy: Secondary | ICD-10-CM | POA: Diagnosis not present

## 2020-03-04 DIAGNOSIS — L97522 Non-pressure chronic ulcer of other part of left foot with fat layer exposed: Secondary | ICD-10-CM | POA: Diagnosis not present

## 2020-03-04 MED ORDER — GENTAMICIN SULFATE 0.1 % EX CREA
1.0000 | TOPICAL_CREAM | Freq: Two times a day (BID) | CUTANEOUS | 1 refills | Status: DC
Start: 2020-03-04 — End: 2020-07-06

## 2020-03-04 NOTE — Progress Notes (Signed)
   Subjective:  60 y.o. male with PMHx of diabetes mellitus presenting today for follow-up evaluation of a diabetic foot ulcer to the left great toe.  Patient is being actively managed by Dr. Lilian Kapur here in our practice.  Patient states that over the last few days he has noticed increased redness and swelling to the toe presents today for follow-up treatment and evaluation.  He was prescribed oral Bactrim DS yesterday and has been taking it for approximately 1 day now.   Past Medical History:  Diagnosis Date  . Diabetes mellitus without complication (HCC)   . Hypertension         Objective/Physical Exam General: The patient is alert and oriented x3 in no acute distress.  Dermatology:  Wound #1 noted to the plantar aspect of the left great toe measuring approximately 2.0 x 2.0 x 0.3 cm (LxWxD).   To the noted ulceration(s), there is no eschar. There is a moderate amount of slough, fibrin, and necrotic tissue noted. Granulation tissue and wound base is red. There is a minimal amount of serosanguineous drainage noted. There is no exposed bone muscle-tendon ligament or joint. There is no malodor. Periwound integrity is intact. Skin is warm, dry and supple bilateral lower extremities.  Vascular: Palpable pedal pulses bilaterally. No edema or erythema noted. Capillary refill within normal limits.  Neurological: Epicritic and protective threshold diminished bilaterally.   Musculoskeletal Exam: Range of motion within normal limits to all pedal and ankle joints bilateral. Muscle strength 5/5 in all groups bilateral.   Assessment: 1.  Ulcer left hallux secondary to diabetes mellitus 2. diabetes mellitus w/ peripheral neuropathy   Plan of Care:  1. Patient was evaluated. 2. medically necessary excisional debridement including subcutaneous tissue was performed using a tissue nipper and a chisel blade. Excisional debridement of all the necrotic nonviable tissue down to healthy bleeding  viable tissue was performed with post-debridement measurements same as pre-. 3. the wound was cleansed and dry sterile dressing applied. 4.  Prescription for gentamicin cream apply 2 times daily  5.  Resume postsurgical shoe that the patient has at home.  Weightbearing as tolerated  6.  Offloading felt dancers pads were provided for the patient to apply to the insole of the postsurgical shoe to offload pressure from the hallux 7. patient is to return to clinic in 2 weeks.   Felecia Shelling, DPM [facility]  Dr. Felecia Shelling, DPM    [address] [place], [place] [address]                Office [phone]  Fax [phone]

## 2020-03-07 ENCOUNTER — Ambulatory Visit: Payer: Managed Care, Other (non HMO) | Admitting: Podiatry

## 2020-03-16 ENCOUNTER — Other Ambulatory Visit: Payer: Self-pay

## 2020-03-16 ENCOUNTER — Encounter: Payer: Self-pay | Admitting: Podiatry

## 2020-03-16 ENCOUNTER — Ambulatory Visit (INDEPENDENT_AMBULATORY_CARE_PROVIDER_SITE_OTHER): Payer: Managed Care, Other (non HMO) | Admitting: Podiatry

## 2020-03-16 DIAGNOSIS — E0843 Diabetes mellitus due to underlying condition with diabetic autonomic (poly)neuropathy: Secondary | ICD-10-CM

## 2020-03-16 DIAGNOSIS — L97522 Non-pressure chronic ulcer of other part of left foot with fat layer exposed: Secondary | ICD-10-CM

## 2020-03-16 DIAGNOSIS — M205X2 Other deformities of toe(s) (acquired), left foot: Secondary | ICD-10-CM

## 2020-03-16 DIAGNOSIS — E1142 Type 2 diabetes mellitus with diabetic polyneuropathy: Secondary | ICD-10-CM

## 2020-03-18 NOTE — Progress Notes (Signed)
  Subjective:  Patient ID: Steven Nielsen, male    DOB: [birth year],  MRN: [id number]  Chief Complaint  Patient presents with  . Foot Ulcer    60 y.o. male returns with the above complaint. History confirmed with patient.   Using the collagen dressings.  He had some worsening of the wound and had to come see Dr. Logan Bores.  Doing better now.  Objective:  Physical Exam:   warm, good capillary refill, no trophic changes or ulcerative lesions, normal DP and PT pulses and abnormal monofilament exam with LOPS. Left Foot: Full-thickness ulceration of subcutaneous tissue of the plantar left hallux, with moderate hyperkeratosis around the wound with significant improvement from photo with Dr. Logan Bores visit, measurements today post debridement measure 0.5 cm x 0.5 centimeters by 0.1 cm.  No erythema, edema, mild serous drainage and no purulence or malodor.                Assessment:   1. Diabetes mellitus due to underlying condition with diabetic autonomic neuropathy, unspecified whether long term insulin use (HCC)   2. Ulcer of great toe, left, with fat layer exposed (HCC)   3. Type 2 diabetes mellitus with polyneuropathy (HCC)   4. Hallux limitus of left foot      Plan:  Patient was evaluated and treated and all questions answered.  Patient educated on diabetes. Discussed proper diabetic foot care and discussed risks and complications of disease. Educated patient in depth on reasons to return to the office immediately should he/she discover anything concerning or new on the feet. All questions answered. Discussed proper shoes as well.   Ulcer left hallux -Debridement as below -He has been using gentamicin cream that Dr. Logan Bores prescribed, continue this at home   Procedure: Excisional Debridement of Wound Rationale: Removal of non-viable soft tissue from the wound to promote healing.  Anesthesia: none Pre-Debridement Wound Measurements: 0.5 x 0.5 x 0.1 cm Post-Debridement Wound  Measurements: Same as predebridement Type of Debridement: Sharp selective Tissue Removed: Non-viable soft tissue Depth of Debridement: subcutaneous tissue. Technique: Sharp selective debridement to bleeding, viable wound base.  Dressing: Dry, sterile, compression dressing. Disposition: Patient tolerated procedure well.  Return in 3 weeks for reevaluation

## 2020-03-30 ENCOUNTER — Other Ambulatory Visit: Payer: Self-pay

## 2020-03-30 ENCOUNTER — Encounter: Payer: Self-pay | Admitting: Podiatry

## 2020-03-30 ENCOUNTER — Ambulatory Visit (INDEPENDENT_AMBULATORY_CARE_PROVIDER_SITE_OTHER): Payer: Managed Care, Other (non HMO) | Admitting: Podiatry

## 2020-03-30 DIAGNOSIS — L97522 Non-pressure chronic ulcer of other part of left foot with fat layer exposed: Secondary | ICD-10-CM

## 2020-03-30 DIAGNOSIS — E1142 Type 2 diabetes mellitus with diabetic polyneuropathy: Secondary | ICD-10-CM

## 2020-03-30 DIAGNOSIS — E0843 Diabetes mellitus due to underlying condition with diabetic autonomic (poly)neuropathy: Secondary | ICD-10-CM

## 2020-03-31 ENCOUNTER — Encounter: Payer: Self-pay | Admitting: Podiatry

## 2020-03-31 NOTE — Progress Notes (Signed)
  Subjective:  Patient ID: Steven Nielsen, male    DOB: [birth year],  MRN: [id number]  Chief Complaint  Patient presents with  . Foot Ulcer    "its doing better"    60 y.o. male returns with the above complaint. History confirmed with patient.   Using the collagen dressings.    Objective:  Physical Exam:   warm, good capillary refill, no trophic changes or ulcerative lesions, normal DP and PT pulses and abnormal monofilament exam with LOPS. Left Foot: Full-thickness ulceration of subcutaneous tissue of the plantar left hallux, with moderate hyperkeratosis around the wound, measurements today post debridement measure 0.4 cm x 0.4 centimeters by 0.1 cm.  No erythema, edema, mild serous drainage and no purulence or malodor.       Assessment:   1. Ulcer of great toe, left, with fat layer exposed (HCC)   2. Diabetes mellitus due to underlying condition with diabetic autonomic neuropathy, unspecified whether long term insulin use (HCC)   3. Type 2 diabetes mellitus with polyneuropathy (HCC)      Plan:  Patient was evaluated and treated and all questions answered.  Patient educated on diabetes. Discussed proper diabetic foot care and discussed risks and complications of disease. Educated patient in depth on reasons to return to the office immediately should he/she discover anything concerning or new on the feet. All questions answered. Discussed proper shoes as well.   Ulcer left hallux -Debridement as below -He has been using gentamicin cream that Dr. Logan Bores prescribed, continue this at home   Unfortunate today I was running behind on my schedule, is delayed performing an urgent procedure on a patient previous to him.  This caused him to miss his work window and he missed work today.  As a courtesy I have not filed any charges for today's visit.  Apologized to him for the delay he was understandably very frustrated.  Hopefully will not happen again in the future.  Procedure:  Excisional Debridement of Wound Rationale: Removal of non-viable soft tissue from the wound to promote healing.  Anesthesia: none Pre-Debridement Wound Measurements: 0.4 x 0.4 x 0.1 cm Post-Debridement Wound Measurements: Same as predebridement Type of Debridement: Sharp selective Tissue Removed: Non-viable soft tissue Depth of Debridement: subcutaneous tissue. Technique: Sharp selective debridement to bleeding, viable wound base.  Dressing: Dry, sterile, compression dressing. Disposition: Patient tolerated procedure well.  Return in 3 weeks for reevaluation

## 2020-04-13 ENCOUNTER — Ambulatory Visit (INDEPENDENT_AMBULATORY_CARE_PROVIDER_SITE_OTHER): Payer: Managed Care, Other (non HMO) | Admitting: Podiatry

## 2020-04-13 ENCOUNTER — Encounter: Payer: Self-pay | Admitting: Podiatry

## 2020-04-13 ENCOUNTER — Telehealth: Payer: Self-pay | Admitting: *Deleted

## 2020-04-13 ENCOUNTER — Other Ambulatory Visit: Payer: Self-pay

## 2020-04-13 DIAGNOSIS — L97522 Non-pressure chronic ulcer of other part of left foot with fat layer exposed: Secondary | ICD-10-CM | POA: Diagnosis not present

## 2020-04-13 DIAGNOSIS — E0843 Diabetes mellitus due to underlying condition with diabetic autonomic (poly)neuropathy: Secondary | ICD-10-CM

## 2020-04-13 DIAGNOSIS — E1142 Type 2 diabetes mellitus with diabetic polyneuropathy: Secondary | ICD-10-CM

## 2020-04-13 NOTE — Telephone Encounter (Signed)
Dr. Lilian Kapur has a busy schedule today.  I attempted to call Mr. Steven Nielsen to see if he wants to reschedule his appointment for today because it's possible he may have to wait.  He was upset about having to wait on his last visit.  He did not answer.  I left him a message to call me back.

## 2020-04-17 NOTE — Progress Notes (Signed)
  Subjective:  Patient ID: Steven Nielsen, male    DOB: [birth year],  MRN: [id number]  Chief Complaint  Patient presents with  . Foot Ulcer    "its getting better"    60 y.o. male returns with the above complaint. History confirmed with patient.    Objective:  Physical Exam:   warm, good capillary refill, no trophic changes or ulcerative lesions, normal DP and PT pulses and abnormal monofilament exam with LOPS. Left Foot: Full-thickness ulceration of subcutaneous tissue of the plantar left hallux, with moderate hyperkeratosis around the wound, measurements today post debridement measure 0.3 cm x 0.3 centimeters by 0.1 cm.  No erythema, edema, mild serous drainage and no purulence or malodor.         Assessment:   No diagnosis found.   Plan:  Patient was evaluated and treated and all questions answered.  Patient educated on diabetes. Discussed proper diabetic foot care and discussed risks and complications of disease. Educated patient in depth on reasons to return to the office immediately should he/she discover anything concerning or new on the feet. All questions answered. Discussed proper shoes as well.   Ulcer left hallux -Debridement as below -Return to collagen dressing to see if we get this to fill in the depth finally   Procedure: Excisional Debridement of Wound Rationale: Removal of non-viable soft tissue from the wound to promote healing.  Anesthesia: none Pre-Debridement Wound Measurements: 0.3 x 0.3 x 0.1 cm Post-Debridement Wound Measurements: Same as predebridement Type of Debridement: Sharp selective Tissue Removed: Non-viable soft tissue Depth of Debridement: subcutaneous tissue. Technique: Sharp selective debridement to bleeding, viable wound base.  Dressing: Dry, sterile, compression dressing. Disposition: Patient tolerated procedure well.

## 2020-04-21 ENCOUNTER — Encounter: Payer: Self-pay | Admitting: Podiatry

## 2020-04-27 ENCOUNTER — Other Ambulatory Visit: Payer: Self-pay

## 2020-04-27 ENCOUNTER — Encounter: Payer: Self-pay | Admitting: Podiatry

## 2020-04-27 ENCOUNTER — Ambulatory Visit (INDEPENDENT_AMBULATORY_CARE_PROVIDER_SITE_OTHER): Payer: Managed Care, Other (non HMO) | Admitting: Podiatry

## 2020-04-27 DIAGNOSIS — L97522 Non-pressure chronic ulcer of other part of left foot with fat layer exposed: Secondary | ICD-10-CM

## 2020-04-27 DIAGNOSIS — M205X2 Other deformities of toe(s) (acquired), left foot: Secondary | ICD-10-CM

## 2020-04-27 DIAGNOSIS — E0843 Diabetes mellitus due to underlying condition with diabetic autonomic (poly)neuropathy: Secondary | ICD-10-CM

## 2020-05-01 NOTE — Progress Notes (Signed)
  Subjective:  Patient ID: Steven Nielsen, male    DOB: 1960/03/10,  MRN: [id number]  Chief Complaint  Patient presents with  . Wound Check    "some days it looks better than others"    60 y.o. male returns with the above complaint. History confirmed with patient.  Wound continues to wax and wane, he is frustrated with his progress  Objective:  Physical Exam:   warm, good capillary refill, no trophic changes or ulcerative lesions, normal DP and PT pulses and abnormal monofilament exam with LOPS. Left Foot: Full-thickness ulceration of subcutaneous tissue of the plantar left hallux, with moderate hyperkeratosis around the wound, measurements today post debridement 1 cm x 0.5 cm, increased in the last visit.  No erythema, edema, mild serous drainage and no purulence or malodor.           Assessment:   1. Ulcer of great toe, left, with fat layer exposed (HCC)   2. Diabetes mellitus due to underlying condition with diabetic autonomic neuropathy, unspecified whether long term insulin use (HCC)   3. Hallux limitus of left foot      Plan:  Patient was evaluated and treated and all questions answered.  Patient educated on diabetes. Discussed proper diabetic foot care and discussed risks and complications of disease. Educated patient in depth on reasons to return to the office immediately should he/she discover anything concerning or new on the feet. All questions answered. Discussed proper shoes as well.   Ulcer left hallux -Debridement as below -Continue collagen -At this point his chronic wound is stagnated has been very frustrating to heal for him.  I think we should continue advance treatment and consider further options including possible total contact casting and/or amniotic grafting, I will discuss with the company to have his insurance authorized for this.   Procedure: Excisional Debridement of Wound Rationale: Removal of non-viable soft tissue from the wound to promote  healing.  Anesthesia: none Pre-Debridement Wound Measurements:1 cm x 0.5 cm,x 0.1 cm Post-Debridement Wound Measurements: Same as predebridement Type of Debridement: Sharp selective Tissue Removed: Non-viable soft tissue Depth of Debridement: subcutaneous tissue. Technique: Sharp selective debridement to bleeding, viable wound base.  Dressing: Dry, sterile, compression dressing. Disposition: Patient tolerated procedure well.

## 2020-05-18 ENCOUNTER — Ambulatory Visit: Payer: Managed Care, Other (non HMO) | Admitting: Podiatry

## 2020-05-18 ENCOUNTER — Telehealth: Payer: Self-pay | Admitting: *Deleted

## 2020-05-18 NOTE — Telephone Encounter (Signed)
Steven Nielsen is calling for the telephone # and address of patient for prior  aurthorization status form.Was given information requested.

## 2020-05-23 ENCOUNTER — Telehealth: Payer: Self-pay | Admitting: Podiatry

## 2020-05-23 NOTE — Telephone Encounter (Signed)
Patients wife calling to inform Dr. Lilian Kapur that wound under patients toe has "spilt", and become infected. States that patient had some left over antibiotic to treat infection. Oozing subsided but toe is still red and sore. Will this affect patients graft coming up on Wednesday?

## 2020-05-23 NOTE — Telephone Encounter (Signed)
Not sure. If he was out of antibiotic, do you think his wound will be ok until his appointment with just regular wound care (cleaning, soaking, ect)?

## 2020-05-23 NOTE — Telephone Encounter (Signed)
We'll have to see. Does he need more antibiotics or does he have enough to continue until then?

## 2020-05-24 NOTE — Telephone Encounter (Signed)
yes

## 2020-05-25 ENCOUNTER — Encounter: Payer: Self-pay | Admitting: Podiatry

## 2020-05-25 ENCOUNTER — Other Ambulatory Visit: Payer: Self-pay

## 2020-05-25 ENCOUNTER — Ambulatory Visit (INDEPENDENT_AMBULATORY_CARE_PROVIDER_SITE_OTHER): Payer: Managed Care, Other (non HMO) | Admitting: Podiatry

## 2020-05-25 DIAGNOSIS — L97522 Non-pressure chronic ulcer of other part of left foot with fat layer exposed: Secondary | ICD-10-CM

## 2020-05-25 DIAGNOSIS — E0843 Diabetes mellitus due to underlying condition with diabetic autonomic (poly)neuropathy: Secondary | ICD-10-CM

## 2020-05-25 DIAGNOSIS — M205X2 Other deformities of toe(s) (acquired), left foot: Secondary | ICD-10-CM

## 2020-05-25 MED ORDER — SULFAMETHOXAZOLE-TRIMETHOPRIM 800-160 MG PO TABS
1.0000 | ORAL_TABLET | Freq: Two times a day (BID) | ORAL | 0 refills | Status: DC
Start: 2020-05-25 — End: 2020-06-01

## 2020-05-26 ENCOUNTER — Telehealth: Payer: Self-pay | Admitting: Podiatry

## 2020-05-26 NOTE — Telephone Encounter (Signed)
Tinia from [facility] called about Tareq Dwan  Swatzell stating that his graph was denied prior authorization.  Call back number [phone]

## 2020-05-30 ENCOUNTER — Encounter: Payer: Self-pay | Admitting: Podiatry

## 2020-05-30 NOTE — Telephone Encounter (Signed)
Patients wife calling to inquire about next steps for patients graft. Skin graft for the patient was denied because the A1C results here not available for review. It was stated 4/14 that prior auth was needed again. Please advise.

## 2020-05-30 NOTE — Telephone Encounter (Signed)
Can you let them know we are working on the appeal for it, the company is donating a sample for [place] to use this Wednesday

## 2020-05-30 NOTE — Progress Notes (Signed)
  Subjective:  Patient ID: Steven Nielsen, male    DOB: Jul 01, 1960,  MRN: [id number]  Chief Complaint  Patient presents with  . Wound Check  . Nail Problem    "its worse.  It split open on Sunday.  I also have an ingrown toenail on the same toe on both sides"    61 y.o. male returns with the above complaint. History confirmed with patient.  Feels like it has gotten worse  Objective:  Physical Exam:   warm, good capillary refill, no trophic changes or ulcerative lesions, normal DP and PT pulses and abnormal monofilament exam with LOPS. Left Foot: Full-thickness ulceration of subcutaneous tissue of the plantar left hallux, with moderate hyperkeratosis around the wound, measurements today post debridement 1.5 cm x 0.5 cm, increased in the last visit.  No erythema, edema, mild serous drainage and no purulence or malodor.           Assessment:   1. Ulcer of great toe, left, with fat layer exposed (HCC)   2. Diabetes mellitus due to underlying condition with diabetic autonomic neuropathy, unspecified whether long term insulin use (HCC)   3. Hallux limitus of left foot      Plan:  Patient was evaluated and treated and all questions answered.  Patient educated on diabetes. Discussed proper diabetic foot care and discussed risks and complications of disease. Educated patient in depth on reasons to return to the office immediately should he/she discover anything concerning or new on the feet. All questions answered. Discussed proper shoes as well.   Ulcer left hallux -Debridement as below -Still waiting approval for graft.  We will hold off on total contact casting today since it has a worsening of the wound I would like him to be able to change it.  Dressing applied today with a silver alginate which she will do at home -MRI to evaluate for osteomyelitis since this continues to worsen -Bactrim prescription sent to pharmacy   Procedure: Excisional Debridement of Wound Rationale:  Removal of non-viable soft tissue from the wound to promote healing.  Anesthesia: none Pre-Debridement Wound Measurements:1.5 cm x 0.5 cm,x 0.1 cm Post-Debridement Wound Measurements: Same as predebridement Type of Debridement: Sharp selective Tissue Removed: Non-viable soft tissue Depth of Debridement: subcutaneous tissue. Technique: Sharp selective debridement to bleeding, viable wound base.  Dressing: Dry, sterile, compression dressing. Disposition: Patient tolerated procedure well.

## 2020-05-31 ENCOUNTER — Ambulatory Visit
Admission: RE | Admit: 2020-05-31 | Discharge: 2020-05-31 | Disposition: A | Discharge status: Home / Self Care | Payer: Managed Care, Other (non HMO) | Source: Ambulatory Visit | Attending: Podiatry | Admitting: Podiatry

## 2020-05-31 DIAGNOSIS — L97522 Non-pressure chronic ulcer of other part of left foot with fat layer exposed: Secondary | ICD-10-CM

## 2020-06-01 ENCOUNTER — Encounter: Payer: Self-pay | Admitting: Podiatry

## 2020-06-01 ENCOUNTER — Telehealth: Payer: Self-pay | Admitting: Podiatry

## 2020-06-01 ENCOUNTER — Other Ambulatory Visit: Payer: Self-pay

## 2020-06-01 ENCOUNTER — Ambulatory Visit (INDEPENDENT_AMBULATORY_CARE_PROVIDER_SITE_OTHER): Payer: Managed Care, Other (non HMO) | Admitting: Podiatry

## 2020-06-01 DIAGNOSIS — L97522 Non-pressure chronic ulcer of other part of left foot with fat layer exposed: Secondary | ICD-10-CM

## 2020-06-01 DIAGNOSIS — E0843 Diabetes mellitus due to underlying condition with diabetic autonomic (poly)neuropathy: Secondary | ICD-10-CM

## 2020-06-01 DIAGNOSIS — M86172 Other acute osteomyelitis, left ankle and foot: Secondary | ICD-10-CM

## 2020-06-01 MED ORDER — SULFAMETHOXAZOLE-TRIMETHOPRIM 800-160 MG PO TABS: 1.0000 | ORAL_TABLET | Freq: Two times a day (BID) | ORAL | 0 refills | Status: DC

## 2020-06-01 NOTE — Telephone Encounter (Signed)
Please change location of referral from the office in [place] to the infectious disease office in [place]. Provider in the [place] location will be out of the office for a month so patient needs to be seen in [place] office for urgent referral.

## 2020-06-01 NOTE — Progress Notes (Signed)
Subjective:  Patient ID: Steven Nielsen, male    DOB: 08-30-1960,  MRN: [id number]  Chief Complaint  Patient presents with  . Wound Check    "I think its looking a little better"    60 y.o. male returns with the above complaint. History confirmed with patient.  He is taking the Bactrim.  He completed the MRI.  Feels he may be doing better  Objective:  Physical Exam:   warm, good capillary refill, no trophic changes or ulcerative lesions, normal DP and PT pulses and abnormal monofilament exam with LOPS. Left Foot: Full-thickness ulceration of subcutaneous tissue of the plantar left hallux, with moderate hyperkeratosis around the wound, measurements today post debridement 0.7 cm x 0.5 cm, has decreased since the last visit.  No erythema, edema, mild serous drainage and no purulence or malodor.        Study Result  Narrative & Impression  CLINICAL DATA:  Plantar great toe ulcer.  EXAM: MRI OF THE LEFT TOES WITHOUT CONTRAST  TECHNIQUE: Multiplanar, multisequence MR imaging of the left forefoot was performed. No intravenous contrast was administered.  COMPARISON:  None.  FINDINGS: Bones/Joint/Cartilage  Abnormal marrow edema within the first distal phalanx with corresponding mildly decreased T1 marrow signal. No fracture or dislocation. Normal alignment. No joint effusion.  Ligaments  Collateral ligaments are intact.  Lisfranc ligament is intact.  Muscles and Tendons Flexor and extensor tendons are intact. Increased T2 signal within and atrophy of the intrinsic muscles of the forefoot, nonspecific, but likely related to diabetic muscle changes.  Soft tissue Small skin ulceration at the plantar base of the first IP joint. No fluid collection or hematoma. No soft tissue mass.  IMPRESSION: 1. Small skin ulceration at the plantar base of the first IP joint with underlying osteomyelitis of the first distal phalanx. No abscess.   Electronically Signed    By: Obie Dredge M.D.   On: 05/31/2020 17:46         Assessment:   1. Ulcer of great toe, left, with fat layer exposed (HCC)   2. Diabetes mellitus due to underlying condition with diabetic autonomic neuropathy, unspecified whether long term insulin use (HCC)   3. Acute osteomyelitis of toe of left foot (HCC)      Plan:  Patient was evaluated and treated and all questions answered.  Patient educated on diabetes. Discussed proper diabetic foot care and discussed risks and complications of disease. Educated patient in depth on reasons to return to the office immediately should he/she discover anything concerning or new on the feet. All questions answered. Discussed proper shoes as well.   Ulcer left hallux -Debridement as below -His insurance has denied coverage for Apligraf, however [employer] has donated a PuraPly AM.  This was applied today following debridement, secured with Steri-Strips and dressed with KerraMax and Coban dressing.  Lot number [id number]  5.1.1 a expiration 09/20/22 6 units is used -Reviewed the findings of the MRI with detail with the patient and his wife.  I discussed with him that I am not convinced by the findings of this being full-blown osteomyelitis.  As his wound has never probe to bone although it has been present for nearly 9 months now.  I am in favor of treating with antibiotics and I am referring him to the infectious disease center for consultation.  I do not think that amputation would be reasonable given his current clinical presentation and improvement.  I think this will resolve and improve with antibiotics and  hopefully be able to heal without recurrence.  If necessary would be open to a bone biopsy and culture to guide treatment -Continue Bactrim for now -Well-padded total contact cast was applied he will be WBAT in this in the walking cast boot which was also dispensed  Procedure: Excisional Debridement of Wound Rationale: Removal of  non-viable soft tissue from the wound to promote healing.  Anesthesia: none Pre-Debridement Wound Measurements:0.7 cm x 0.5 cm,x 0.2 cm Post-Debridement Wound Measurements: Same as predebridement Type of Debridement: Sharp selective Tissue Removed: Non-viable soft tissue Depth of Debridement: subcutaneous tissue. Technique: Sharp selective debridement to bleeding, viable wound base.  Dressing: Dry, sterile, compression dressing. Disposition: Patient tolerated procedure well.

## 2020-06-01 NOTE — Telephone Encounter (Signed)
OK thanks - will do.

## 2020-06-06 ENCOUNTER — Other Ambulatory Visit: Payer: Self-pay

## 2020-06-06 ENCOUNTER — Ambulatory Visit (INDEPENDENT_AMBULATORY_CARE_PROVIDER_SITE_OTHER): Payer: Managed Care, Other (non HMO) | Admitting: Infectious Disease

## 2020-06-06 ENCOUNTER — Ambulatory Visit (INDEPENDENT_AMBULATORY_CARE_PROVIDER_SITE_OTHER): Payer: Managed Care, Other (non HMO)

## 2020-06-06 ENCOUNTER — Encounter: Payer: Self-pay | Admitting: Infectious Disease

## 2020-06-06 ENCOUNTER — Telehealth: Payer: Self-pay

## 2020-06-06 VITALS — BP 130/80 | HR 73 | Wt 271.0 lb

## 2020-06-06 DIAGNOSIS — E1142 Type 2 diabetes mellitus with diabetic polyneuropathy: Secondary | ICD-10-CM | POA: Diagnosis not present

## 2020-06-06 DIAGNOSIS — L97522 Non-pressure chronic ulcer of other part of left foot with fat layer exposed: Secondary | ICD-10-CM

## 2020-06-06 DIAGNOSIS — E119 Type 2 diabetes mellitus without complications: Secondary | ICD-10-CM

## 2020-06-06 DIAGNOSIS — I10 Essential (primary) hypertension: Secondary | ICD-10-CM

## 2020-06-06 DIAGNOSIS — E11621 Type 2 diabetes mellitus with foot ulcer: Secondary | ICD-10-CM | POA: Diagnosis not present

## 2020-06-06 DIAGNOSIS — L97528 Non-pressure chronic ulcer of other part of left foot with other specified severity: Secondary | ICD-10-CM | POA: Diagnosis not present

## 2020-06-06 DIAGNOSIS — E1169 Type 2 diabetes mellitus with other specified complication: Secondary | ICD-10-CM

## 2020-06-06 DIAGNOSIS — M869 Osteomyelitis, unspecified: Secondary | ICD-10-CM

## 2020-06-06 HISTORY — DX: Osteomyelitis, unspecified: M86.9

## 2020-06-06 NOTE — Progress Notes (Signed)
Total contact casted removed from left lower extremity without complication. Dressing still in place, clean, dry and intact. Patient was placed in surgical shoe that was brought from home. Patient advised to keep appointment scheduled with Dr. Lilian Kapur on 06/08/20. Also advised patient to call the office with any questions or concerns. Patient verbalized understanding.

## 2020-06-06 NOTE — Telephone Encounter (Signed)
Patient notified of PICC line appointment on 4/28 at noon. Instructed to show at 11:45 at [facility] radiology and that he will receive first dose after placement.   Orders faxed to [facility] and [facility].   Will send information to patient via mychart.   Autrey Human Loyola Mast, RN

## 2020-06-06 NOTE — Progress Notes (Signed)
Subjective:   Reason for Infectious Disease consult: Osteomyelitis of the fifth great toe left side  Requesting physician: Sharl Ma DPM     Patient ID: Steven Nielsen, male    DOB: 06-16-1960, 60 y.o.   MRN: [id number]  HPI    Marcas is a 60 year old Caucasian man who has diabetes mellitus hypertension hyperlipidemia who has significant peripheral neuropathy due to his diabetes mellitus.  His diabetes itself is relatively well controlled with an A1c just above 7.  He has not required insulin he has not developed peripheral vascular disease and is not a smoker.  He originally presented to urgent care on 17 November 2019 with some painful swelling on the plantar surface of his left great toe.  He told me that prior to his coming to urgent care he had served what he thought was of a "rock or foreign body in his toe which he removed,  Was then followed by erythema and swelling.  He went to see urgent care as mentioned.  Started on Augmentin twice daily for 7 days but did not tolerate this well due to loose bowel movements he was having while on Augmentin.  Was referred to podiatry has been seeing Dr. Lilian Kapur since then.  According him and his wife he has had serial debridements of soft tissue and has had improvement with taking Bactrim.  Once he comes off the Bactrim though he ends up experiencing recurrence of erythema in the toe.  This is been going on is mention for more than 6 months.  In the interim, Dorinda Hill obtained an MRI of the toes.  This shows him to have normal marrow edema in the first distal phalanx concerning for osteomyelitis of the distal phalanx.  Referred to our clinic for evaluation management of diabetic foot infection with osteomyelitis.      Past Medical History:  Diagnosis Date  . Diabetes mellitus without complication (HCC)   . Hypertension     Past Surgical History:  Procedure Laterality Date  . SHOULDER ARTHROSCOPY      Family History   Problem Relation Age of Onset  . Hyperlipidemia Mother   . Stroke Father   . Hyperlipidemia Father   . Diabetes Father   . Cancer Father   . Hyperlipidemia Sister   . Diabetes Sister   . Cancer Paternal Uncle        colon  . Stroke Paternal Grandmother   . Hypertension Paternal Grandfather   . Diabetes Paternal Grandfather       Social History   Socioeconomic History  . Marital status: Married    Spouse name: Not on file  . Number of children: Not on file  . Years of education: Not on file  . Highest education level: Not on file  Occupational History  . Not on file  Tobacco Use  . Smoking status: Never Smoker  . Smokeless tobacco: Never Used  Vaping Use  . Vaping Use: Never used  Substance and Sexual Activity  . Alcohol use: Not Currently  . Drug use: Never  . Sexual activity: Not on file  Other Topics Concern  . Not on file  Social History Narrative  . Not on file   Social Determinants of Health   Financial Resource Strain: Not on file  Food Insecurity: Not on file  Transportation Needs: Not on file  Physical Activity: Not on file  Stress: Not on file  Social Connections: Not on file    Allergies  Allergen Reactions  .  Sumatriptan Rash and Shortness Of Breath    Flushing, diaphoresis, & shortness of breath  . Amoxicillin-Pot Clavulanate Diarrhea    Nausea, diarrhea, headache     Current Outpatient Medications:  .  Acetaminophen (TYLENOL 8 HOUR ARTHRITIS PAIN PO), , Disp: , Rfl:  .  amLODipine (NORVASC) 5 MG tablet, Take 5 mg by mouth daily., Disp: , Rfl:  .  ascorbic acid (VITAMIN C) 500 MG tablet, Take by mouth., Disp: , Rfl:  .  baclofen (LIORESAL) 10 MG tablet, Take 10 mg by mouth 2 (two) times daily., Disp: , Rfl:  .  butalbital-acetaminophen-caffeine (FIORICET) 50-325-40 MG tablet, Take by mouth., Disp: , Rfl:  .  Cholecalciferol 125 MCG (5000 UT) TABS, Take by mouth., Disp: , Rfl:  .  DULoxetine (CYMBALTA) 30 MG capsule, TAKE 1 CAPSULE(30 MG)  BY MOUTH EVERY DAY, Disp: , Rfl:  .  gabapentin (NEURONTIN) 300 MG capsule, Take 300 mg by mouth daily., Disp: , Rfl:  .  gabapentin (NEURONTIN) 400 MG capsule, Take 400 mg by mouth daily., Disp: , Rfl:  .  gentamicin cream (GARAMYCIN) 0.1 %, Apply 1 application topically 2 (two) times daily., Disp: 30 g, Rfl: 1 .  losartan (COZAAR) 100 MG tablet, Take 100 mg by mouth daily., Disp: , Rfl:  .  metFORMIN (GLUCOPHAGE-XR) 750 MG 24 hr tablet, Take 750 mg by mouth daily., Disp: , Rfl:  .  mupirocin ointment (BACTROBAN) 2 %, Apply 1 application topically 2 (two) times daily., Disp: 30 g, Rfl: 2 .  Omega-3 Fatty Acids (FISH OIL) 1000 MG CAPS, Take 1,000 mg by mouth daily., Disp: , Rfl:  .  Omeprazole 20 MG TBDD, , Disp: , Rfl:  .  Oxymetazoline HCl (NASAL SPRAY) 0.05 % SOLN, Place into the nose., Disp: , Rfl:  .  sulfamethoxazole-trimethoprim (BACTRIM DS) 800-160 MG tablet, Take 1 tablet by mouth 2 (two) times daily., Disp: 20 tablet, Rfl: 0 .  zolmitriptan (ZOMIG) 5 MG tablet, 5 mg once as needed, Disp: , Rfl:    Review of Systems  Constitutional: Negative for activity change, appetite change, chills, diaphoresis, fatigue, fever and unexpected weight change.  HENT: Negative for congestion, rhinorrhea, sinus pressure, sneezing, sore throat and trouble swallowing.   Eyes: Negative for photophobia and visual disturbance.  Respiratory: Negative for cough, chest tightness, shortness of breath, wheezing and stridor.   Cardiovascular: Negative for chest pain, palpitations and leg swelling.  Gastrointestinal: Negative for abdominal distention, abdominal pain, anal bleeding, blood in stool, constipation, diarrhea, nausea and vomiting.  Genitourinary: Negative for difficulty urinating, dysuria, flank pain and hematuria.  Musculoskeletal: Negative for arthralgias, back pain, gait problem, joint swelling and myalgias.  Skin: Negative for color change, pallor, rash and wound.  Neurological: Negative for  dizziness, tremors, weakness and light-headedness.  Hematological: Negative for adenopathy. Does not bruise/bleed easily.  Psychiatric/Behavioral: Negative for agitation, behavioral problems, confusion, decreased concentration, dysphoric mood and sleep disturbance.       Objective:   Physical Exam Constitutional:      General: He is not in acute distress.    Appearance: Normal appearance. He is well-developed. He is not ill-appearing or diaphoretic.  HENT:     Head: Normocephalic and atraumatic.     Right Ear: Hearing and external ear normal.     Left Ear: Hearing and external ear normal.     Nose: No nasal deformity or rhinorrhea.  Eyes:     General: No scleral icterus.    Conjunctiva/sclera: Conjunctivae normal.  Right eye: Right conjunctiva is not injected.     Left eye: Left conjunctiva is not injected.     Pupils: Pupils are equal, round, and reactive to light.  Neck:     Vascular: No JVD.  Cardiovascular:     Rate and Rhythm: Normal rate and regular rhythm.     Heart sounds: S1 normal and S2 normal.  Pulmonary:     Effort: Pulmonary effort is normal. No respiratory distress.     Breath sounds: No wheezing.  Abdominal:     General: Bowel sounds are normal. There is no distension.     Palpations: Abdomen is soft.     Tenderness: There is no abdominal tenderness.  Musculoskeletal:        General: Normal range of motion.     Right shoulder: Normal.     Left shoulder: Normal.     Cervical back: Normal range of motion and neck supple.     Right hip: Normal.     Left hip: Normal.     Right knee: Normal.     Left knee: Normal.  Lymphadenopathy:     Head:     Right side of head: No submandibular, preauricular or posterior auricular adenopathy.     Left side of head: No submandibular, preauricular or posterior auricular adenopathy.     Cervical: No cervical adenopathy.     Right cervical: No superficial or deep cervical adenopathy.    Left cervical: No superficial or  deep cervical adenopathy.  Skin:    General: Skin is warm and dry.     Coloration: Skin is not pale.     Findings: No abrasion, bruising, ecchymosis, erythema, lesion or rash.     Nails: There is no clubbing.  Neurological:     Mental Status: He is alert and oriented to person, place, and time.     Sensory: No sensory deficit.     Coordination: Coordination normal.     Gait: Gait normal.  Psychiatric:        Attention and Perception: He is attentive.        Mood and Affect: Mood normal.        Speech: Speech normal.        Behavior: Behavior normal. Behavior is cooperative.        Thought Content: Thought content normal.        Judgment: Judgment normal.     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  We will plan on giving him 6 weeks of IV daptomycin 10 mg/kg daily along with ceftriaxone 2 g IV daily.  Will reassess how he is doing in roughly 1 month's time.  I spent greater than 80 minutes with the patient including greater than 50% of time in face to face counsel of the patient  And his wife re the nature of diabetic foot infections, bone infections, reviewing pertinent labs, MRI films personally and in coordination of his care with home health and  infusion center.

## 2020-06-08 ENCOUNTER — Ambulatory Visit (INDEPENDENT_AMBULATORY_CARE_PROVIDER_SITE_OTHER): Payer: Managed Care, Other (non HMO) | Admitting: Podiatry

## 2020-06-08 ENCOUNTER — Encounter: Payer: Self-pay | Admitting: Podiatry

## 2020-06-08 ENCOUNTER — Other Ambulatory Visit: Payer: Self-pay

## 2020-06-08 ENCOUNTER — Other Ambulatory Visit (HOSPITAL_COMMUNITY): Payer: Self-pay | Admitting: *Deleted

## 2020-06-08 DIAGNOSIS — L97522 Non-pressure chronic ulcer of other part of left foot with fat layer exposed: Secondary | ICD-10-CM

## 2020-06-08 DIAGNOSIS — M86172 Other acute osteomyelitis, left ankle and foot: Secondary | ICD-10-CM

## 2020-06-08 NOTE — Progress Notes (Signed)
Subjective:  Patient ID: Steven Nielsen, male    DOB: January 28, 1961,  MRN: [id number]  Chief Complaint  Patient presents with  . Wound Check    "its doing ok"    60 y.o. male returns with the above complaint. History confirmed with patient.  Tomorrow he is getting his PICC line and getting daptomycin and ceftriaxone  Objective:  Physical Exam:   warm, good capillary refill, no trophic changes or ulcerative lesions, normal DP and PT pulses and abnormal monofilament exam with LOPS. Left Foot: Full-thickness ulceration of subcutaneous tissue of the plantar left hallux, with moderate hyperkeratosis around the wound, measurements today post debridement 0.5 cm x 0.3 cm, has decreased since the last visit looks much improved in character and.  No erythema, edema, mild serous drainage and no purulence or malodor.        Study Result  Narrative & Impression  CLINICAL DATA:  Plantar great toe ulcer.  EXAM: MRI OF THE LEFT TOES WITHOUT CONTRAST  TECHNIQUE: Multiplanar, multisequence MR imaging of the left forefoot was performed. No intravenous contrast was administered.  COMPARISON:  None.  FINDINGS: Bones/Joint/Cartilage  Abnormal marrow edema within the first distal phalanx with corresponding mildly decreased T1 marrow signal. No fracture or dislocation. Normal alignment. No joint effusion.  Ligaments  Collateral ligaments are intact.  Lisfranc ligament is intact.  Muscles and Tendons Flexor and extensor tendons are intact. Increased T2 signal within and atrophy of the intrinsic muscles of the forefoot, nonspecific, but likely related to diabetic muscle changes.  Soft tissue Small skin ulceration at the plantar base of the first IP joint. No fluid collection or hematoma. No soft tissue mass.  IMPRESSION: 1. Small skin ulceration at the plantar base of the first IP joint with underlying osteomyelitis of the first distal phalanx.  No abscess.   Electronically Signed   By: Titus Dubin M.D.   On: 05/31/2020 17:46         Assessment:   1. Acute osteomyelitis of toe of left foot ([place])      Plan:  Patient was evaluated and treated and all questions answered.  Patient educated on diabetes. Discussed proper diabetic foot care and discussed risks and complications of disease. Educated patient in depth on reasons to return to the office immediately should he/she discover anything concerning or new on the feet. All questions answered. Discussed proper shoes as well.   Ulcer left hallux -Debridement as below -His insurance has denied coverage for Apligraf, however organogenesis has donated a PuraPly AM.  This was applied today following debridement, secured with Steri-Strips and dressed with KerraMax and Coban dressing.  Lot number [id number]  5.1.1 a expiration 09/20/22 3 units is used -Did not tolerate total contact casting, continue WBAT in surgical shoe with offloading dancer pad -May consider bone biopsy and culture as an outpatient, will discuss with Dr. Tommy Medal -Return in 1 week -Recommend we check a CBC with differential and ESR as well.  As he is getting lab work done tomorrow when he gets his PICC line, they can draw this and I given him lab orders well  Procedure: Excisional Debridement of Wound Rationale: Removal of non-viable soft tissue from the wound to promote healing.  Anesthesia: none Pre-Debridement Wound Measurements:0.5 cm x 0.3 cm Post-Debridement Wound Measurements: Same as predebridement Type of Debridement: Sharp selective Tissue Removed: Non-viable soft tissue Depth of Debridement: subcutaneous tissue. Technique: Sharp selective debridement to bleeding, viable wound base.  Dressing: Dry, sterile, compression dressing. Disposition:  Patient tolerated procedure well.

## 2020-06-09 ENCOUNTER — Ambulatory Visit (HOSPITAL_COMMUNITY)
Admission: RE | Admit: 2020-06-09 | Discharge: 2020-06-09 | Disposition: A | Discharge status: Home / Self Care | Payer: Managed Care, Other (non HMO) | Source: Ambulatory Visit | Attending: Infectious Disease | Admitting: Infectious Disease

## 2020-06-09 ENCOUNTER — Ambulatory Visit (HOSPITAL_COMMUNITY)
Admission: RE | Admit: 2020-06-09 | Discharge: 2020-06-09 | Disposition: A | Discharge status: Home / Self Care | Payer: Managed Care, Other (non HMO) | Source: Ambulatory Visit | Attending: Radiology | Admitting: Radiology

## 2020-06-09 ENCOUNTER — Other Ambulatory Visit: Payer: Self-pay | Admitting: Infectious Disease

## 2020-06-09 DIAGNOSIS — Z452 Encounter for adjustment and management of vascular access device: Secondary | ICD-10-CM

## 2020-06-09 DIAGNOSIS — M869 Osteomyelitis, unspecified: Secondary | ICD-10-CM

## 2020-06-09 DIAGNOSIS — I878 Other specified disorders of veins: Secondary | ICD-10-CM | POA: Insufficient documentation

## 2020-06-09 HISTORY — PX: IR FLUORO GUIDE CV LINE RIGHT: IMG2283

## 2020-06-09 MED ORDER — LIDOCAINE HCL 1 % IJ SOLN
INTRAMUSCULAR | Status: AC
  Administered 2020-06-09: 5 mL via SUBCUTANEOUS
  Filled 2020-06-09: qty 20

## 2020-06-09 MED ORDER — SODIUM CHLORIDE 0.9 % IV SOLN
10.0000 mg/kg | Freq: Once | INTRAVENOUS | Status: AC
  Administered 2020-06-09: 1250 mg via INTRAVENOUS
  Filled 2020-06-09: qty 25

## 2020-06-09 MED ORDER — HEPARIN SOD (PORK) LOCK FLUSH 100 UNIT/ML IV SOLN: 250.0000 [IU] | Freq: Every day | INTRAVENOUS | Status: DC

## 2020-06-09 MED ORDER — HEPARIN SOD (PORK) LOCK FLUSH 100 UNIT/ML IV SOLN
INTRAVENOUS | Status: AC
  Administered 2020-06-09: 250 [IU]
  Filled 2020-06-09: qty 5

## 2020-06-09 MED ORDER — HEPARIN SOD (PORK) LOCK FLUSH 100 UNIT/ML IV SOLN: 250.0000 [IU] | INTRAVENOUS | Status: DC | PRN

## 2020-06-09 MED ORDER — SODIUM CHLORIDE 0.9 % IV SOLN
2.0000 g | Freq: Once | INTRAVENOUS | Status: AC
  Administered 2020-06-09: 2 g via INTRAVENOUS
  Filled 2020-06-09: qty 20

## 2020-06-09 NOTE — Discharge Instructions (Signed)
Daptomycin injection What is this medicine? DAPTOMYCIN (DAP toe MYE sin) is a lipopeptide antibiotic. It is used to treat certain kinds of bacterial infections. It will not work for colds, flu, or other viral infections. This medicine may be used for other purposes; ask your health care provider or pharmacist if you have questions. COMMON BRAND NAME(S): Cubicin, Cubicin RF What should I tell my health care provider before I take this medicine? They need to know if you have any of these conditions:  kidney disease  stomach or intestine problems like colitis  an unusual or allergic reaction to daptomycin, other medicines, foods, dyes, or preservatives  pregnant or trying to get pregnant  breast-feeding How should I use this medicine? This medicine is for infusion into a vein. It is usually given by a health care professional in a hospital or clinic setting. If you get this medicine at home, you will be taught how to prepare and give this medicine. Use exactly as directed. Take your medicine at regular intervals. Do not take your medicine more often than directed. Take all of your medicine as directed even if you think you are better. Do not skip doses or stop your medicine early. It is important that you put your used needles and syringes in a special sharps container. Do not put them in a trash can. If you do not have a sharps container, call your pharmacist or healthcare provider to get one. Talk to your pediatrician regarding the use of this medicine in children. While this drug may be prescribed for children as young as 1 year for selected conditions, precautions do apply. Overdosage: If you think you have taken too much of this medicine contact a poison control center or emergency room at once. NOTE: This medicine is only for you. Do not share this medicine with others. What if I miss a dose? It is important not to miss your dose. Call your doctor or health care professional if you are  unable to keep an appointment. If you give yourself this medicine at home and you miss a dose, take it as soon as you can. If it is almost time for your next dose, take only that dose. Do not take double or extra doses. What may interact with this medicine? This medicine may interact with the following medications:  birth control pills  certain medicines for cholesterol like atorvastatin, lovastatin, and simvastatin This list may not describe all possible interactions. Give your health care provider a list of all the medicines, herbs, non-prescription drugs, or dietary supplements you use. Also tell them if you smoke, drink alcohol, or use illegal drugs. Some items may interact with your medicine. What should I watch for while using this medicine? Your condition will be monitored carefully while you are receiving this drug. Tell your health care provider if your symptoms do not start to get better or if they get worse. Do not treat diarrhea with over the counter products. Contact your health care provider if you have diarrhea that lasts more than 2 days or if it is severe and watery. This drug may cause serious skin reactions. They can happen weeks to months after starting the drug. Contact your health care provider right away if you notice fevers or flu-like symptoms with a rash. The rash may be red or purple and then turn into blisters or peeling of the skin. Or, you might notice a red rash with swelling of the face, lips or lymph nodes in your neck  or under your arms. What side effects may I notice from receiving this medicine? Side effects that you should report to your doctor or health care professional as soon as possible:  allergic reactions like skin rash, itching or hives, swelling of the face, lips, or tongue  bloody or watery diarrhea  fever  kidney injury (trouble passing urine or change in the amount of urine)  muscle injury (dark urine; trouble passing urine or change in the  amount of urine; unusually weak or tired; muscle pain; back pain)  rash, fever, and swollen lymph nodes  redness, blistering, peeling, or loosening of the skin, including inside the mouth Side effects that usually do not require medical attention (report to your doctor or health care professional if they continue or are bothersome):  diarrhea  dizziness  headache  trouble sleeping  vomiting This list may not describe all possible side effects. Call your doctor for medical advice about side effects. You may report side effects to FDA at 1-800-FDA-1088. Where should I keep my medicine? Keep out of the reach of children. This drug is usually given in a hospital or clinic and will not be stored at home. In rare cases, this medicine may be given at home. If you are using this medicine at home, you will be instructed on how to store it. Throw away any unused medicine after the expiration date on the label. NOTE: This sheet is a summary. It may not cover all possible information. If you have questions about this medicine, talk to your doctor, pharmacist, or health care provider.  2021 Elsevier/Gold Standard (2018-10-17 11:49:02)   Ceftriaxone Injection What is this medicine? CEFTRIAXONE (sef try AX one) is a cephalosporin antibiotic. It treats some infections caused by bacteria. It will not work for colds, the flu, or other viruses. This medicine may be used for other purposes; ask your health care provider or pharmacist if you have questions. COMMON BRAND NAME(S): Ceftrisol Plus, Rocephin What should I tell my health care provider before I take this medicine? They need to know if you have any of these conditions:  any chronic illness  bowel disease, like colitis  both kidney and liver disease  high bilirubin level in newborn patients  an unusual or allergic reaction to ceftriaxone, other cephalosporin or penicillin antibiotics, foods, dyes, or preservatives  pregnant or trying  to get pregnant  breast-feeding How should I use this medicine? This drug is injected into a muscle or a vein. It is usually given by a health care provider in a hospital or clinic setting. If you get this drug at home, you will be taught how to prepare and give it. Use exactly as directed. Take it as directed on the prescription label at the same time every day. Keep taking it unless your health care provider tells you to stop. It is important that you put your used needles and syringes in a special sharps container. Do not put them in a trash can. If you do not have a sharps container, call your pharmacist or health care provider to get one. Talk to your health care provider about the use of this drug in children. While it may be prescribed for children as young as newborns for selected conditions, precautions do apply. Overdosage: If you think you have taken too much of this medicine contact a poison control center or emergency room at once. NOTE: This medicine is only for you. Do not share this medicine with others. What if I miss  a dose? It is important not to miss your dose. Call your health care provider if you are unable to keep an appointment. If you give yourself this drug at home and you miss a dose, take it as soon as you can. If it is almost time for your next dose, take only that dose. Do not take double or extra doses. What may interact with this medicine? Do not take this medicine with any of the following medications:  intravenous calcium This medicine may also interact with the following medications:  birth control pills This list may not describe all possible interactions. Give your health care provider a list of all the medicines, herbs, non-prescription drugs, or dietary supplements you use. Also tell them if you smoke, drink alcohol, or use illegal drugs. Some items may interact with your medicine. What should I watch for while using this medicine? Tell your doctor or  health care provider if your symptoms do not improve or if they get worse. This medicine may cause serious skin reactions. They can happen weeks to months after starting the medicine. Contact your health care provider right away if you notice fevers or flu-like symptoms with a rash. The rash may be red or purple and then turn into blisters or peeling of the skin. Or, you might notice a red rash with swelling of the face, lips or lymph nodes in your neck or under your arms. Do not treat diarrhea with over the counter products. Contact your doctor if you have diarrhea that lasts more than 2 days or if it is severe and watery. If you are being treated for a sexually transmitted disease, avoid sexual contact until you have finished your treatment. Having sex can infect your sexual partner. Calcium may bind to this medicine and cause lung or kidney problems. Avoid calcium products while taking this medicine and for 48 hours after taking the last dose of this medicine. What side effects may I notice from receiving this medicine? Side effects that you should report to your doctor or health care professional as soon as possible:  allergic reactions like skin rash, itching or hives, swelling of the face, lips, or tongue  breathing problems  fever, chills  irregular heartbeat  pain when passing urine  redness, blistering, peeling, or loosening of the skin, including inside the mouth  seizures  stomach pain, cramps  unusual bleeding, bruising  unusually weak or tired Side effects that usually do not require medical attention (report to your doctor or health care professional if they continue or are bothersome):  diarrhea  dizzy, drowsy  headache  nausea, vomiting  pain, swelling, irritation where injected  stomach upset  sweating This list may not describe all possible side effects. Call your doctor for medical advice about side effects. You may report side effects to FDA at  1-800-FDA-1088. Where should I keep my medicine? Keep out of the reach of children and pets. You will be instructed on how to store this drug. Protect from light. Throw away any unused drug after the expiration date. NOTE: This sheet is a summary. It may not cover all possible information. If you have questions about this medicine, talk to your doctor, pharmacist, or health care provider.  2021 Elsevier/Gold Standard (2018-09-04 18:29:21)

## 2020-06-09 NOTE — Procedures (Signed)
Successful placement of single lumen PICC line to right basilic vein. Length 44 cm Tip at lower SVC/RA PICC capped No complications Ready for use.  EBL < 5 mL   Brayton El PA-C 06/09/2020 12:11 PM

## 2020-06-10 ENCOUNTER — Other Ambulatory Visit: Payer: Self-pay

## 2020-06-10 DIAGNOSIS — R110 Nausea: Secondary | ICD-10-CM

## 2020-06-10 MED ORDER — ONDANSETRON HCL 4 MG PO TABS: 4.0000 mg | ORAL_TABLET | Freq: Three times a day (TID) | ORAL | 1 refills | Status: DC | PRN

## 2020-06-15 ENCOUNTER — Other Ambulatory Visit: Payer: Self-pay

## 2020-06-15 ENCOUNTER — Ambulatory Visit (INDEPENDENT_AMBULATORY_CARE_PROVIDER_SITE_OTHER): Payer: Managed Care, Other (non HMO) | Admitting: Podiatry

## 2020-06-15 DIAGNOSIS — L97522 Non-pressure chronic ulcer of other part of left foot with fat layer exposed: Secondary | ICD-10-CM | POA: Diagnosis not present

## 2020-06-15 DIAGNOSIS — E0843 Diabetes mellitus due to underlying condition with diabetic autonomic (poly)neuropathy: Secondary | ICD-10-CM

## 2020-06-15 DIAGNOSIS — M86172 Other acute osteomyelitis, left ankle and foot: Secondary | ICD-10-CM

## 2020-06-16 ENCOUNTER — Encounter: Payer: Self-pay | Admitting: Podiatry

## 2020-06-16 NOTE — Progress Notes (Signed)
Subjective:  Patient ID: Steven Nielsen, male    DOB: 04/04/1960,  MRN: [id number]  Chief Complaint  Patient presents with  . Foot Ulcer    1 week follow up left foot    60 y.o. male returns with the above complaint. History confirmed with patient.  Has a PICC line and getting daptomycin and ceftriaxone  Objective:  Physical Exam:   warm, good capillary refill, no trophic changes or ulcerative lesions, normal DP and PT pulses and abnormal monofilament exam with LOPS. Left Foot: Full-thickness ulceration of subcutaneous tissue of the plantar left hallux, with moderate hyperkeratosis around the wound, measurements today post debridement 0.5 cm x 0.3 cm, similar dimensions to the last visit looks much improved in character and.  No erythema, edema, mild serous drainage and no purulence or malodor.          Study Result  Narrative & Impression  CLINICAL DATA:  Plantar great toe ulcer.  EXAM: MRI OF THE LEFT TOES WITHOUT CONTRAST  TECHNIQUE: Multiplanar, multisequence MR imaging of the left forefoot was performed. No intravenous contrast was administered.  COMPARISON:  None.  FINDINGS: Bones/Joint/Cartilage  Abnormal marrow edema within the first distal phalanx with corresponding mildly decreased T1 marrow signal. No fracture or dislocation. Normal alignment. No joint effusion.  Ligaments  Collateral ligaments are intact.  Lisfranc ligament is intact.  Muscles and Tendons Flexor and extensor tendons are intact. Increased T2 signal within and atrophy of the intrinsic muscles of the forefoot, nonspecific, but likely related to diabetic muscle changes.  Soft tissue Small skin ulceration at the plantar base of the first IP joint. No fluid collection or hematoma. No soft tissue mass.  IMPRESSION: 1. Small skin ulceration at the plantar base of the first IP joint with underlying osteomyelitis of the first distal phalanx. No abscess.   Electronically  Signed   By: Obie Dredge M.D.   On: 05/31/2020 17:46         Assessment:   1. Acute osteomyelitis of toe of left foot (HCC)   2. Ulcer of great toe, left, with fat layer exposed (HCC)   3. Diabetes mellitus due to underlying condition with diabetic autonomic neuropathy, unspecified whether long term insulin use (HCC)      Plan:  Patient was evaluated and treated and all questions answered.  Patient educated on diabetes. Discussed proper diabetic foot care and discussed risks and complications of disease. Educated patient in depth on reasons to return to the office immediately should he/she discover anything concerning or new on the feet. All questions answered. Discussed proper shoes as well.   Ulcer left hallux -Debridement as below -His insurance has denied coverage for Apligraf, however organogenesis has donated a PuraPly AM.  This was applied today following debridement, secured with Steri-Strips and dressed with silver alginate and Coban dressing.  Lot number [id number]  5.1.1 a expiration 09/20/22 3 units is used -Did not tolerate total contact casting, continue WBAT in surgical shoe with offloading dancer pad -Discussed with Dr. Daiva Eves now that he has been on intravenous anabiotic's culture and biopsy likely be low yield may consider if he is worsening  Procedure: Excisional Debridement of Wound Rationale: Removal of non-viable soft tissue from the wound to promote healing.  Anesthesia: none Pre-Debridement Wound Measurements:0.5 cm x 0.3 cm Post-Debridement Wound Measurements: Same as predebridement Type of Debridement: Sharp selective Tissue Removed: Non-viable soft tissue Depth of Debridement: subcutaneous tissue. Technique: Sharp selective debridement to bleeding, viable wound base.  Dressing: Dry, sterile, compression dressing. Disposition: Patient tolerated procedure well.

## 2020-06-20 NOTE — Telephone Encounter (Signed)
RN spoke to Tim at [facility] to relay verbal orders per Dr. Daiva Eves for stat CPK level. Orders repeated and verified.   Sandie Ano, RN

## 2020-06-20 NOTE — Telephone Encounter (Signed)
RN spoke with Steven Nielsen at [facility]. She states she has contacted nursing and advised them to draw a stat CPK if it was not already done with this morning's labs. Steven Nielsen states she spoke with the patient and he reports taking benadryl for the swelling. Steven Nielsen states that she advised the patient to go to the emergency room if his symptoms worsen. RN relayed to Steven Nielsen that Dr. Daiva Eves has the patient holding his antibiotics for now.   Sandie Ano, RN

## 2020-06-21 ENCOUNTER — Telehealth: Payer: Self-pay | Admitting: *Deleted

## 2020-06-21 NOTE — Telephone Encounter (Signed)
Steven Nielsen stated, "I am very upset.  Steven Nielsen has been telling us that he has called [employer] several times and done several peer to peer reviews for the skin graft and that they keep denying it.  I called [employer] and they said they had not received a peer to peer review from Steven Nielsen nor spoken to him.  He has been lying to [place]!  He has let this go on for 7 months and we've seen him two times a week!  He never did a MRI until now nor did he do bloodwork.  Steven Nielsen never followed through like he should!  Infectious Disease asked why no blood work had been done or a MRI before now.  They said this could have been prevented.  Now my husband may lose his toe!  He could possibly develop sepsis and die!  This should never have happened.  He's told us peer to peer was done several times!  Why would he lie?  Now it may be too late to get in with someone else."  I can get him in to see another doctor here.  I will let Steven Nielsen, Office Administrator, know about your concern as well as Steven Nielsen.  "He's seeing Steven Nielsen on Friday."  I apologize and as I stated, I will get Steven Nielsen to call you.  "How soon will she call me, will it be today?"  I will send the message to her now.

## 2020-06-22 ENCOUNTER — Ambulatory Visit: Payer: Managed Care, Other (non HMO) | Admitting: Podiatry

## 2020-06-22 NOTE — Telephone Encounter (Signed)
Amy from [facility] called, states nursing drew stat CPK in wrong tube. She has requested they go out to redraw stat today, but that results probably won't be back until tomorrow.   Sandie Ano, RN

## 2020-06-23 ENCOUNTER — Other Ambulatory Visit: Payer: Self-pay

## 2020-06-23 ENCOUNTER — Ambulatory Visit (INDEPENDENT_AMBULATORY_CARE_PROVIDER_SITE_OTHER): Payer: Managed Care, Other (non HMO) | Admitting: Infectious Disease

## 2020-06-23 ENCOUNTER — Telehealth: Payer: Self-pay | Admitting: *Deleted

## 2020-06-23 ENCOUNTER — Encounter: Payer: Self-pay | Admitting: Infectious Disease

## 2020-06-23 VITALS — BP 138/85 | HR 73 | Temp 98.0°F | Wt 263.0 lb

## 2020-06-23 DIAGNOSIS — L97522 Non-pressure chronic ulcer of other part of left foot with fat layer exposed: Secondary | ICD-10-CM

## 2020-06-23 DIAGNOSIS — R220 Localized swelling, mass and lump, head: Secondary | ICD-10-CM

## 2020-06-23 DIAGNOSIS — T7840XA Allergy, unspecified, initial encounter: Secondary | ICD-10-CM

## 2020-06-23 DIAGNOSIS — M869 Osteomyelitis, unspecified: Secondary | ICD-10-CM | POA: Diagnosis not present

## 2020-06-23 DIAGNOSIS — M609 Myositis, unspecified: Secondary | ICD-10-CM | POA: Diagnosis not present

## 2020-06-23 HISTORY — DX: Myositis, unspecified: M60.9

## 2020-06-23 HISTORY — DX: Localized swelling, mass and lump, head: R22.0

## 2020-06-23 MED ORDER — SULFAMETHOXAZOLE-TRIMETHOPRIM 800-160 MG PO TABS: 1.0000 | ORAL_TABLET | Freq: Two times a day (BID) | ORAL | 2 refills | Status: DC

## 2020-06-23 NOTE — Progress Notes (Signed)
Per verbal order from Dr Daiva Eves, 44 cm Single Lumen Peripherally Inserted Central Catheter removed from right basilic, tip intact. No sutures present. RN confirmed length per chart. Dressing was clean and dry. Petroleum dressing applied. Patient advised no heavy lifting with this arm, leave dressing for 24 hours and call the office or seek emergent care if dressing becomes soaked with blood, swelling, or sharp pain presents. Patient verbalized understanding and agreement.  Patient's questions answered to their satisfaction. Patient tolerated procedure well, RN walked patient to check out. Pharmacy notified. Andree Coss, RN

## 2020-06-23 NOTE — Progress Notes (Signed)
Subjective:    Chief complaint: Tongue swelling and oral sores   Patient ID: Steven Nielsen, male    DOB: 01-27-1961, 60 y.o.   MRN: [id number]  HPI     Steven Nielsen is a 60 year old Caucasian man who has diabetes mellitus hypertension hyperlipidemia who has significant peripheral neuropathy due to his diabetes mellitus.  His diabetes itself is relatively well controlled with an A1c just above 7.  He has not required insulin he has not developed peripheral vascular disease and is not a smoker.  He originally presented to urgent care on 17 November 2019 with some painful swelling on the plantar surface of his left great toe.  He told me that prior to his coming to urgent care he had served what he thought was of a "rock or foreign body in his toe which he removed,  Was then followed by erythema and swelling.  He went to see urgent care as mentioned.  Started on Augmentin twice daily for 7 days but did not tolerate this well due to loose bowel movements he was having while on Augmentin.  Was referred to podiatry has been seeing Steven Nielsen since then.  According him and his wife he has had serial debridements of soft tissue and has had improvement with taking Bactrim.  Once he comes off the Bactrim though he ends up experiencing recurrence of erythema in the toe.  This is been going on is mention for more than 6 months.  In the interim, Steven Nielsen obtained an MRI of the toes.  This shows him to have normal marrow edema in the first distal phalanx concerning for osteomyelitis of the distal phalanx.  He was  to our clinic for evaluation management of diabetic foot infection with osteomyelitis.  I told him I thought that ultimately he would likely need an amputation of his toe if he indeed had osteomyelitis here.  However I told him we were willing to try parenteral antibiotics to see if this does gain some control over this infection.  We had PICC line placed and ultimately started ceftriaxone  and daptomycin.  He had not gotten much into therapy having only completed 10 days.  Towards the end of therapy he began developing swelling of his lip.  He first noticed that foods that are not spicy caused his tongue to burn and this bothered him.  He continue to take antibiotics but he continued to have worsening sore tongue which became further swollen and he was then not able to make whistles that he normally could whistle in his voice change due to the swelling of his tongue.   He also developed myalgias in his lower extremity muscles.  His CPK was found to be elevated as well.  He called our clinic and asked that he stop all antibiotics which he did.  His myalgias have resolved but sore tongue and swollen tongue persist though it is dramatically improved since stopping the antibiotics.  He has been very frustrated with all the time that he has had missed from work while trying to deal with this infected toe.  He would like to now be referred to a surgeon for potential surgical cure of his bone infection.   Past Medical History:  Diagnosis Date  . Diabetes mellitus without complication (HCC)   . Hypertension   . Osteomyelitis of great toe of left foot (HCC) 06/06/2020  . Tongue swelling 06/23/2020    Past Surgical History:  Procedure Laterality Date  . IR FLUORO GUIDE  CV LINE RIGHT  06/09/2020  . SHOULDER ARTHROSCOPY      Family History  Problem Relation Age of Onset  . Hyperlipidemia Mother   . Stroke Father   . Hyperlipidemia Father   . Diabetes Father   . Cancer Father   . Hyperlipidemia Sister   . Diabetes Sister   . Cancer Paternal Uncle        colon  . Stroke Paternal Grandmother   . Hypertension Paternal Grandfather   . Diabetes Paternal Grandfather       Social History   Socioeconomic History  . Marital status: Married    Spouse name: Not on file  . Number of children: Not on file  . Years of education: Not on file  . Highest education level: Not on file   Occupational History  . Not on file  Tobacco Use  . Smoking status: Never Smoker  . Smokeless tobacco: Never Used  Vaping Use  . Vaping Use: Never used  Substance and Sexual Activity  . Alcohol use: Not Currently  . Drug use: Never  . Sexual activity: Not on file  Other Topics Concern  . Not on file  Social History Narrative  . Not on file   Social Determinants of Health   Financial Resource Strain: Not on file  Food Insecurity: Not on file  Transportation Needs: Not on file  Physical Activity: Not on file  Stress: Not on file  Social Connections: Not on file    Allergies  Allergen Reactions  . Ceftriaxone Other (See Comments)    Tongue swelling, lip swelling with rocephin and dapto  . Daptomycin     Elevated CK myositis, ? Allergic rxn tongue and lip swelling  . Sumatriptan Rash and Shortness Of Breath    Flushing, diaphoresis, & shortness of breath  . Amoxicillin-Pot Clavulanate Diarrhea    Nausea, diarrhea, headache     Current Outpatient Medications:  .  Acetaminophen (TYLENOL 8 HOUR ARTHRITIS PAIN PO), , Disp: , Rfl:  .  amLODipine (NORVASC) 5 MG tablet, Take 5 mg by mouth daily., Disp: , Rfl:  .  ascorbic acid (VITAMIN C) 500 MG tablet, Take by mouth., Disp: , Rfl:  .  baclofen (LIORESAL) 10 MG tablet, Take 10 mg by mouth 2 (two) times daily., Disp: , Rfl:  .  butalbital-acetaminophen-caffeine (FIORICET) 50-325-40 MG tablet, Take by mouth., Disp: , Rfl:  .  Cholecalciferol 125 MCG (5000 UT) TABS, Take by mouth., Disp: , Rfl:  .  DULoxetine (CYMBALTA) 30 MG capsule, TAKE 1 CAPSULE(30 MG) BY MOUTH EVERY DAY, Disp: , Rfl:  .  gabapentin (NEURONTIN) 300 MG capsule, Take 300 mg by mouth daily., Disp: , Rfl:  .  gabapentin (NEURONTIN) 400 MG capsule, Take 400 mg by mouth daily., Disp: , Rfl:  .  gentamicin cream (GARAMYCIN) 0.1 %, Apply 1 application topically 2 (two) times daily. (Patient not taking: Reported on 06/06/2020), Disp: 30 g, Rfl: 1 .  losartan (COZAAR)  100 MG tablet, Take 100 mg by mouth daily., Disp: , Rfl:  .  metFORMIN (GLUCOPHAGE-XR) 750 MG 24 hr tablet, Take 750 mg by mouth daily., Disp: , Rfl:  .  mupirocin ointment (BACTROBAN) 2 %, Apply 1 application topically 2 (two) times daily., Disp: 30 g, Rfl: 2 .  Omega-3 Fatty Acids (FISH OIL) 1000 MG CAPS, Take 1,000 mg by mouth daily., Disp: , Rfl:  .  Omeprazole 20 MG TBDD, , Disp: , Rfl:  .  ondansetron (ZOFRAN) 4 MG tablet,  Take 1 tablet (4 mg total) by mouth every 8 (eight) hours as needed for nausea or vomiting., Disp: 20 tablet, Rfl: 1 .  Oxymetazoline HCl (NASAL SPRAY) 0.05 % SOLN, Place into the nose., Disp: , Rfl:  .  sulfamethoxazole-trimethoprim (BACTRIM DS) 800-160 MG tablet, Take 1 tablet by mouth 2 (two) times daily., Disp: 60 tablet, Rfl: 2 .  zolmitriptan (ZOMIG) 5 MG tablet, 5 mg once as needed, Disp: , Rfl:    Review of Systems  Constitutional: Negative for activity change, appetite change, chills, diaphoresis, fatigue, fever and unexpected weight change.  HENT: Positive for mouth sores and sore throat. Negative for congestion, rhinorrhea, sinus pressure, sneezing and trouble swallowing.   Eyes: Negative for photophobia and visual disturbance.  Respiratory: Negative for cough, chest tightness, shortness of breath, wheezing and stridor.   Cardiovascular: Negative for chest pain, palpitations and leg swelling.  Gastrointestinal: Negative for abdominal distention, abdominal pain, anal bleeding, blood in stool, constipation, diarrhea, nausea and vomiting.  Genitourinary: Negative for difficulty urinating, dysuria, flank pain and hematuria.  Musculoskeletal: Positive for myalgias. Negative for arthralgias, back pain, gait problem and joint swelling.  Skin: Negative for color change, pallor, rash and wound.  Neurological: Negative for dizziness, tremors, weakness and light-headedness.  Hematological: Negative for adenopathy. Does not bruise/bleed easily.  Psychiatric/Behavioral:  Negative for agitation, behavioral problems, confusion, decreased concentration, dysphoric mood and sleep disturbance.       Objective:   Physical Exam Constitutional:      General: He is not in acute distress.    Appearance: Normal appearance. He is well-developed. He is not ill-appearing or diaphoretic.  HENT:     Head: Normocephalic and atraumatic.     Comments: Swollen tongue    Right Ear: Hearing and external ear normal.     Left Ear: Hearing and external ear normal.     Nose: No nasal deformity or rhinorrhea.  Eyes:     General: No scleral icterus.    Conjunctiva/sclera: Conjunctivae normal.     Right eye: Right conjunctiva is not injected.     Left eye: Left conjunctiva is not injected.     Pupils: Pupils are equal, round, and reactive to light.  Neck:     Vascular: No JVD.  Cardiovascular:     Rate and Rhythm: Normal rate and regular rhythm.     Heart sounds: S1 normal and S2 normal.  Pulmonary:     Effort: Pulmonary effort is normal. No respiratory distress.     Breath sounds: No wheezing.  Abdominal:     General: Bowel sounds are normal. There is no distension.     Palpations: Abdomen is soft.     Tenderness: There is no abdominal tenderness.  Musculoskeletal:        General: Normal range of motion.     Right shoulder: Normal.     Left shoulder: Normal.     Cervical back: Normal range of motion and neck supple.     Right hip: Normal.     Left hip: Normal.     Right knee: Normal.     Left knee: Normal.  Lymphadenopathy:     Head:     Right side of head: No submandibular, preauricular or posterior auricular adenopathy.     Left side of head: No submandibular, preauricular or posterior auricular adenopathy.     Cervical: No cervical adenopathy.     Right cervical: No superficial or deep cervical adenopathy.    Left cervical: No superficial or  deep cervical adenopathy.  Skin:    General: Skin is warm and dry.     Coloration: Skin is not pale.     Findings: No  abrasion, bruising, ecchymosis, erythema, lesion or rash.     Nails: There is no clubbing.  Neurological:     General: No focal deficit present.     Mental Status: He is alert and oriented to person, place, and time.     Sensory: No sensory deficit.     Coordination: Coordination normal.     Gait: Gait normal.  Psychiatric:        Attention and Perception: He is attentive.        Mood and Affect: Mood normal.        Speech: Speech normal.        Behavior: Behavior normal. Behavior is cooperative.        Thought Content: Thought content normal.        Judgment: Judgment normal.    PICC :    Foot:      Lateral aspect where he has a blister   Left foot is bandaged     Assessment & Plan:    Diabetic foot ulcer with osteomyelitis of first phalanx of left toe:  He would now like referral to Orthopedic surgery  Made initial referral to Dr. Lajoyce Cornersuda but he is not to be back in town himself until May 30.  The patient and his wife desire evaluation and management as soon it can be accomplished which is one reason they [facility]  I will refer to Dr. Victorino DikeHewitt at [facility]  Discontinue PICC line and discontinue IV antibiotics.  I will place him back on the Bactrim double strength tablet twice daily that kept his infection at bay while seeing Dr. Lilian KapurMcDonald.  Allergic reaction with tongue swelling: Could certainly have been due to the ceftriaxone or the daptomycin  Myositis this was likely due to daptomycin  I spent greater than 40 minutes with the patient including greater than 50% of time in face to face counsel of the patient and in coordination of his care.

## 2020-06-23 NOTE — Telephone Encounter (Signed)
Called [facility] pharmacy, notified Melissa that patient's PICC was pulled at office visit. Andree Coss, RN

## 2020-06-24 ENCOUNTER — Ambulatory Visit: Payer: Managed Care, Other (non HMO) | Admitting: Podiatry

## 2020-06-24 ENCOUNTER — Telehealth: Payer: Self-pay

## 2020-06-24 ENCOUNTER — Ambulatory Visit: Payer: Managed Care, Other (non HMO) | Admitting: Family

## 2020-06-24 NOTE — Telephone Encounter (Signed)
I spoke with Mrs. Steven Nielsen this morning. This pts started coming to our office October 2021 for a ulcer on left hallux. Pt wife states that the pt came every 2 weeks for debridement and assessment. For 6 months, Pt wife states that it was back and forth assessment and antibiotics. During that time no blood work, no x-rays, and no MRI was completed. Not until April 2022, 6 months later is when a MRI and blood work was ordered. Pt wanted to know after being a diabetic and having an ulcer for 6 months why continue with a skin graft. Both the patient and the wife spoke with Steven Nielsen and was never contacted for skin graft. Mrs. Steven Nielsen feels like this should have never got to this point. They have seeked infectious disease and now has a referral to orthopedic surgery and will not be coming back to [facility].  Pt wife doesn't want this to happen to any other patient and feels that a other provider should have been involve to help with guidances. Mrs. Steven Nielsen states that this pt now is going to lose part of his toe and has missed finances from being out of work. I apologized to the pts wife and let her know that she could call me if she had any additional concerns.

## 2020-06-27 ENCOUNTER — Ambulatory Visit: Payer: Managed Care, Other (non HMO) | Admitting: Podiatry

## 2020-06-28 ENCOUNTER — Telehealth: Payer: Self-pay | Admitting: *Deleted

## 2020-06-28 NOTE — Telephone Encounter (Signed)
"  I'm calling to cancel the appointment for Steven Nielsen for Friday, May 13 at 10:15 am with Dr. Logan Bores.  Thank you."  Appointment was canceled.

## 2020-07-01 ENCOUNTER — Telehealth: Payer: Self-pay | Admitting: Infectious Disease

## 2020-07-01 NOTE — Telephone Encounter (Signed)
Dr Victorino Dike sent note to me that patient will have amputation through t distal hallux  Can the patient let us know when he is in the hospital for this so we can help with postop antibiotics?

## 2020-07-04 ENCOUNTER — Other Ambulatory Visit (HOSPITAL_COMMUNITY): Payer: Self-pay | Admitting: Orthopedic Surgery

## 2020-07-04 ENCOUNTER — Telehealth: Payer: Self-pay | Admitting: *Deleted

## 2020-07-04 NOTE — Telephone Encounter (Signed)
"  I'm calling to speak to the Print production planner.  I'm not sure why I was put into medical records unless it's the same number.  I'm calling on behalf of my husband Steven Nielsen.  He's been treated for a toe ulcer since October 7, for the past seven months.  Dr. Lilian Kapur has been trying to get Steven Nielsen approved for a skin graft which got denied on April 11.  Dr. Lilian Kapur has told us on four occasions that he has been appealing the process and that he has had peer to peer contact with [employer].  I just spoke to [facility] for the second time in two weeks and they said they never received any kind of peer to peer, his Alc, and that Dr. Lilian Kapur never reached out.  They said he would have been approved with just one call given the information that's provided.  We have a problem.  Please call us back as soon as possible."  I spoke with the patient's wife on 06/21/2020

## 2020-07-06 ENCOUNTER — Other Ambulatory Visit: Payer: Self-pay

## 2020-07-06 ENCOUNTER — Encounter (HOSPITAL_BASED_OUTPATIENT_CLINIC_OR_DEPARTMENT_OTHER): Payer: Self-pay | Admitting: Orthopedic Surgery

## 2020-07-07 ENCOUNTER — Encounter (HOSPITAL_BASED_OUTPATIENT_CLINIC_OR_DEPARTMENT_OTHER)
Admission: RE | Admit: 2020-07-07 | Discharge: 2020-07-07 | Disposition: A | Discharge status: Home / Self Care | Payer: Managed Care, Other (non HMO) | Source: Ambulatory Visit | Attending: Orthopedic Surgery | Admitting: Orthopedic Surgery

## 2020-07-07 DIAGNOSIS — I10 Essential (primary) hypertension: Secondary | ICD-10-CM | POA: Insufficient documentation

## 2020-07-07 DIAGNOSIS — Z01818 Encounter for other preprocedural examination: Secondary | ICD-10-CM | POA: Insufficient documentation

## 2020-07-07 DIAGNOSIS — E119 Type 2 diabetes mellitus without complications: Secondary | ICD-10-CM | POA: Diagnosis not present

## 2020-07-07 LAB — BASIC METABOLIC PANEL
Anion gap: 10 (ref 5–15)
BUN: 12 mg/dL (ref 6–20)
CO2: 26 mmol/L (ref 22–32)
Calcium: 9.6 mg/dL (ref 8.9–10.3)
Chloride: 101 mmol/L (ref 98–111)
Creatinine, Ser: 1.03 mg/dL (ref 0.61–1.24)
GFR, Estimated: >60 mL/min (ref >60)
Glucose, Bld: 210 mg/dL — ABNORMAL HIGH (ref 70–99)
Potassium: 4.6 mmol/L (ref 3.5–5.1)
Sodium: 137 mmol/L (ref 135–145)

## 2020-07-07 NOTE — Progress Notes (Signed)
      Enhanced Recovery after Surgery for Orthopedics Enhanced Recovery after Surgery is a protocol used to improve the stress on your body and your recovery after surgery.  Patient Instructions  . The night before surgery:  o No food after midnight. ONLY clear liquids after midnight  . The day of surgery (if you do NOT have diabetes):  o Drink ONE (1) Pre-Surgery Clear Ensure as directed.   o This drink was given to you during your hospital  pre-op appointment visit. o The pre-op nurse will instruct you on the time to drink the  Pre-Surgery Ensure depending on your surgery time. o Finish the drink at the designated time by the pre-op nurse.  o Nothing else to drink after completing the  Pre-Surgery Clear Ensure.  . The day of surgery (if you have diabetes): o Drink ONE (1) Gatorade 2 (G2) as directed. o This drink was given to you during your hospital  pre-op appointment visit.  o The pre-op nurse will instruct you on the time to drink the   Gatorade 2 (G2) depending on your surgery time. o Color of the Gatorade may vary. Red is not allowed. o Nothing else to drink after completing the  Gatorade 2 (G2).         If you have questions, please contact your surgeon's office.  Gatorade Zero given to pt with instructions. Pt verbalizes understanding. 

## 2020-07-08 ENCOUNTER — Ambulatory Visit: Payer: Managed Care, Other (non HMO) | Admitting: Infectious Disease

## 2020-07-14 ENCOUNTER — Encounter (HOSPITAL_BASED_OUTPATIENT_CLINIC_OR_DEPARTMENT_OTHER)
Admission: RE | Disposition: A | Discharge status: Home / Self Care | Payer: Self-pay | Source: Home / Self Care | Attending: Orthopedic Surgery

## 2020-07-14 ENCOUNTER — Ambulatory Visit (HOSPITAL_BASED_OUTPATIENT_CLINIC_OR_DEPARTMENT_OTHER): Payer: Managed Care, Other (non HMO) | Admitting: Certified Registered"

## 2020-07-14 ENCOUNTER — Ambulatory Visit (HOSPITAL_BASED_OUTPATIENT_CLINIC_OR_DEPARTMENT_OTHER)
Admission: RE | Admit: 2020-07-14 | Discharge: 2020-07-14 | Disposition: A | Discharge status: Home / Self Care | Payer: Managed Care, Other (non HMO) | Attending: Orthopedic Surgery | Admitting: Orthopedic Surgery

## 2020-07-14 ENCOUNTER — Encounter (HOSPITAL_BASED_OUTPATIENT_CLINIC_OR_DEPARTMENT_OTHER): Payer: Self-pay | Admitting: Orthopedic Surgery

## 2020-07-14 ENCOUNTER — Other Ambulatory Visit: Payer: Self-pay

## 2020-07-14 DIAGNOSIS — Z8249 Family history of ischemic heart disease and other diseases of the circulatory system: Secondary | ICD-10-CM | POA: Diagnosis not present

## 2020-07-14 DIAGNOSIS — Z9049 Acquired absence of other specified parts of digestive tract: Secondary | ICD-10-CM | POA: Diagnosis not present

## 2020-07-14 DIAGNOSIS — Z7984 Long term (current) use of oral hypoglycemic drugs: Secondary | ICD-10-CM | POA: Diagnosis not present

## 2020-07-14 DIAGNOSIS — G4730 Sleep apnea, unspecified: Secondary | ICD-10-CM | POA: Insufficient documentation

## 2020-07-14 DIAGNOSIS — Z881 Allergy status to other antibiotic agents status: Secondary | ICD-10-CM | POA: Diagnosis not present

## 2020-07-14 DIAGNOSIS — Z888 Allergy status to other drugs, medicaments and biological substances status: Secondary | ICD-10-CM | POA: Insufficient documentation

## 2020-07-14 DIAGNOSIS — E11621 Type 2 diabetes mellitus with foot ulcer: Secondary | ICD-10-CM | POA: Insufficient documentation

## 2020-07-14 DIAGNOSIS — Z79899 Other long term (current) drug therapy: Secondary | ICD-10-CM | POA: Insufficient documentation

## 2020-07-14 DIAGNOSIS — M869 Osteomyelitis, unspecified: Secondary | ICD-10-CM | POA: Insufficient documentation

## 2020-07-14 DIAGNOSIS — Z880 Allergy status to penicillin: Secondary | ICD-10-CM | POA: Insufficient documentation

## 2020-07-14 DIAGNOSIS — I10 Essential (primary) hypertension: Secondary | ICD-10-CM | POA: Diagnosis not present

## 2020-07-14 DIAGNOSIS — L97529 Non-pressure chronic ulcer of other part of left foot with unspecified severity: Secondary | ICD-10-CM | POA: Insufficient documentation

## 2020-07-14 DIAGNOSIS — Z833 Family history of diabetes mellitus: Secondary | ICD-10-CM | POA: Insufficient documentation

## 2020-07-14 DIAGNOSIS — E1169 Type 2 diabetes mellitus with other specified complication: Secondary | ICD-10-CM | POA: Diagnosis present

## 2020-07-14 HISTORY — PX: AMPUTATION TOE: SHX6595

## 2020-07-14 HISTORY — DX: Sleep apnea, unspecified: G47.30

## 2020-07-14 HISTORY — DX: Gastro-esophageal reflux disease without esophagitis: K21.9

## 2020-07-14 LAB — GLUCOSE, CAPILLARY
Glucose-Capillary: 154 mg/dL — ABNORMAL HIGH (ref 70–99)
Glucose-Capillary: 151 mg/dL — ABNORMAL HIGH (ref 70–99)

## 2020-07-14 SURGERY — AMPUTATION, TOE
Anesthesia: Monitor Anesthesia Care | Site: Toe | Laterality: Left

## 2020-07-14 MED ORDER — OXYCODONE HCL 5 MG PO TABS: 5.0000 mg | ORAL_TABLET | Freq: Four times a day (QID) | ORAL | 0 refills | Status: AC | PRN

## 2020-07-14 MED ORDER — PROPOFOL 500 MG/50ML IV EMUL
INTRAVENOUS | Status: DC | PRN
  Administered 2020-07-14: 100 ug/kg/min via INTRAVENOUS

## 2020-07-14 MED ORDER — FENTANYL CITRATE (PF) 100 MCG/2ML IJ SOLN: 25.0000 ug | INTRAMUSCULAR | Status: DC | PRN

## 2020-07-14 MED ORDER — MIDAZOLAM HCL 2 MG/2ML IJ SOLN
INTRAMUSCULAR | Status: AC
  Filled 2020-07-14: qty 2

## 2020-07-14 MED ORDER — SODIUM CHLORIDE 0.9 % IV SOLN: INTRAVENOUS | Status: DC

## 2020-07-14 MED ORDER — ONDANSETRON HCL 4 MG/2ML IJ SOLN
INTRAMUSCULAR | Status: DC | PRN
  Administered 2020-07-14: 4 mg via INTRAVENOUS

## 2020-07-14 MED ORDER — VANCOMYCIN HCL IN DEXTROSE 500-5 MG/100ML-% IV SOLN
INTRAVENOUS | Status: AC
  Filled 2020-07-14: qty 100

## 2020-07-14 MED ORDER — PROMETHAZINE HCL 25 MG/ML IJ SOLN: 6.2500 mg | INTRAMUSCULAR | Status: DC | PRN

## 2020-07-14 MED ORDER — VANCOMYCIN HCL IN DEXTROSE 1-5 GM/200ML-% IV SOLN
INTRAVENOUS | Status: AC
  Filled 2020-07-14: qty 200

## 2020-07-14 MED ORDER — ACETAMINOPHEN 325 MG PO TABS
325.0000 mg | ORAL_TABLET | ORAL | Status: DC | PRN
Start: 2020-07-14 — End: 2020-07-14

## 2020-07-14 MED ORDER — ACETAMINOPHEN 160 MG/5ML PO SOLN: 325.0000 mg | ORAL | Status: DC | PRN

## 2020-07-14 MED ORDER — ACETAMINOPHEN 10 MG/ML IV SOLN: 1000.0000 mg | Freq: Once | INTRAVENOUS | Status: DC | PRN

## 2020-07-14 MED ORDER — DEXMEDETOMIDINE (PRECEDEX) IN NS 20 MCG/5ML (4 MCG/ML) IV SYRINGE
PREFILLED_SYRINGE | INTRAVENOUS | Status: DC | PRN
  Administered 2020-07-14: 12 ug via INTRAVENOUS

## 2020-07-14 MED ORDER — CEFAZOLIN SODIUM-DEXTROSE 2-3 GM-%(50ML) IV SOLR
INTRAVENOUS | Status: DC | PRN
  Administered 2020-07-14: 2 g via INTRAVENOUS

## 2020-07-14 MED ORDER — DEXMEDETOMIDINE (PRECEDEX) IN NS 20 MCG/5ML (4 MCG/ML) IV SYRINGE
PREFILLED_SYRINGE | INTRAVENOUS | Status: AC
  Filled 2020-07-14: qty 5

## 2020-07-14 MED ORDER — OXYCODONE HCL 5 MG/5ML PO SOLN
5.0000 mg | Freq: Once | ORAL | Status: DC | PRN
Start: 2020-07-14 — End: 2020-07-14

## 2020-07-14 MED ORDER — FENTANYL CITRATE (PF) 100 MCG/2ML IJ SOLN
INTRAMUSCULAR | Status: DC | PRN
  Administered 2020-07-14: 100 ug via INTRAVENOUS

## 2020-07-14 MED ORDER — DOCUSATE SODIUM 100 MG PO CAPS: 100.0000 mg | ORAL_CAPSULE | Freq: Two times a day (BID) | ORAL | 0 refills | Status: DC

## 2020-07-14 MED ORDER — FENTANYL CITRATE (PF) 100 MCG/2ML IJ SOLN
INTRAMUSCULAR | Status: AC
  Filled 2020-07-14: qty 2

## 2020-07-14 MED ORDER — VANCOMYCIN HCL 500 MG IV SOLR
INTRAVENOUS | Status: DC | PRN
  Administered 2020-07-14: 500 mg via TOPICAL

## 2020-07-14 MED ORDER — LIDOCAINE 2% (20 MG/ML) 5 ML SYRINGE
INTRAMUSCULAR | Status: DC | PRN
  Administered 2020-07-14: 60 mg via INTRAVENOUS

## 2020-07-14 MED ORDER — PROPOFOL 500 MG/50ML IV EMUL
INTRAVENOUS | Status: AC
  Filled 2020-07-14: qty 150

## 2020-07-14 MED ORDER — ONDANSETRON HCL 4 MG/2ML IJ SOLN
INTRAMUSCULAR | Status: AC
  Filled 2020-07-14: qty 14

## 2020-07-14 MED ORDER — SENNA 8.6 MG PO TABS: 2.0000 | ORAL_TABLET | Freq: Two times a day (BID) | ORAL | 0 refills | Status: DC

## 2020-07-14 MED ORDER — 0.9 % SODIUM CHLORIDE (POUR BTL) OPTIME
TOPICAL | Status: DC | PRN
  Administered 2020-07-14: 200 mL

## 2020-07-14 MED ORDER — BUPIVACAINE-EPINEPHRINE 0.5% -1:200000 IJ SOLN
INTRAMUSCULAR | Status: DC | PRN
  Administered 2020-07-14: 15 mL

## 2020-07-14 MED ORDER — LIDOCAINE HCL (PF) 2 % IJ SOLN
INTRAMUSCULAR | Status: AC
  Filled 2020-07-14: qty 15

## 2020-07-14 MED ORDER — MIDAZOLAM HCL 5 MG/5ML IJ SOLN
INTRAMUSCULAR | Status: DC | PRN
  Administered 2020-07-14: 2 mg via INTRAVENOUS

## 2020-07-14 MED ORDER — PHENYLEPHRINE 40 MCG/ML (10ML) SYRINGE FOR IV PUSH (FOR BLOOD PRESSURE SUPPORT)
PREFILLED_SYRINGE | INTRAVENOUS | Status: AC
  Filled 2020-07-14: qty 10

## 2020-07-14 MED ORDER — DEXAMETHASONE SODIUM PHOSPHATE 10 MG/ML IJ SOLN
INTRAMUSCULAR | Status: AC
  Filled 2020-07-14: qty 2

## 2020-07-14 MED ORDER — LACTATED RINGERS IV SOLN: INTRAVENOUS | Status: DC

## 2020-07-14 MED ORDER — ROCURONIUM BROMIDE 10 MG/ML (PF) SYRINGE
PREFILLED_SYRINGE | INTRAVENOUS | Status: AC
  Filled 2020-07-14: qty 10

## 2020-07-14 MED ORDER — OXYCODONE HCL 5 MG PO TABS: 5.0000 mg | ORAL_TABLET | Freq: Once | ORAL | Status: DC | PRN

## 2020-07-14 MED ORDER — VANCOMYCIN HCL 1500 MG/300ML IV SOLN
1500.0000 mg | INTRAVENOUS | Status: DC
  Filled 2020-07-14: qty 300

## 2020-07-14 MED ORDER — AMISULPRIDE (ANTIEMETIC) 5 MG/2ML IV SOLN: 10.0000 mg | Freq: Once | INTRAVENOUS | Status: DC | PRN

## 2020-07-14 SURGICAL SUPPLY — 60 items
APL PRP STRL LF DISP 70% ISPRP (MISCELLANEOUS) ×1
BLADE AVERAGE 25X9 (BLADE) ×2 IMPLANT
BLADE OSC/SAG .038X5.5 CUT EDG (BLADE) IMPLANT
BLADE SURG 10 STRL SS (BLADE) IMPLANT
BLADE SURG 15 STRL LF DISP TIS (BLADE) ×1 IMPLANT
BLADE SURG 15 STRL SS (BLADE) ×2
BNDG CMPR 9X4 STRL LF SNTH (GAUZE/BANDAGES/DRESSINGS) ×1
BNDG COHESIVE 4X5 TAN STRL (GAUZE/BANDAGES/DRESSINGS) ×2 IMPLANT
BNDG CONFORM 3 STRL LF (GAUZE/BANDAGES/DRESSINGS) IMPLANT
BNDG ESMARK 4X9 LF (GAUZE/BANDAGES/DRESSINGS) ×2 IMPLANT
CHLORAPREP W/TINT 26 (MISCELLANEOUS) ×2 IMPLANT
COVER BACK TABLE 60X90IN (DRAPES) ×2 IMPLANT
COVER WAND RF STERILE (DRAPES) IMPLANT
CUFF TOURN SGL QUICK 24 (TOURNIQUET CUFF)
CUFF TRNQT CYL 24X4X16.5-23 (TOURNIQUET CUFF) IMPLANT
DECANTER SPIKE VIAL GLASS SM (MISCELLANEOUS) IMPLANT
DRAPE EXTREMITY T 121X128X90 (DISPOSABLE) ×2 IMPLANT
DRAPE SURG 17X23 STRL (DRAPES) ×2 IMPLANT
DRAPE U-SHAPE 47X51 STRL (DRAPES) ×2 IMPLANT
DRSG EMULSION OIL 3X3 NADH (GAUZE/BANDAGES/DRESSINGS) IMPLANT
DRSG MEPITEL 4X7.2 (GAUZE/BANDAGES/DRESSINGS) ×2 IMPLANT
DRSG PAD ABDOMINAL 8X10 ST (GAUZE/BANDAGES/DRESSINGS) IMPLANT
ELECT REM PT RETURN 9FT ADLT (ELECTROSURGICAL) ×2
ELECTRODE REM PT RTRN 9FT ADLT (ELECTROSURGICAL) ×1 IMPLANT
GAUZE SPONGE 4X4 12PLY STRL (GAUZE/BANDAGES/DRESSINGS) ×2 IMPLANT
GLOVE SRG 8 PF TXTR STRL LF DI (GLOVE) ×2 IMPLANT
GLOVE SURG ENC MOIS LTX SZ8 (GLOVE) ×2 IMPLANT
GLOVE SURG LTX SZ8 (GLOVE) ×2 IMPLANT
GLOVE SURG UNDER POLY LF SZ8 (GLOVE) ×4
GOWN STRL REUS W/ TWL LRG LVL3 (GOWN DISPOSABLE) ×1 IMPLANT
GOWN STRL REUS W/ TWL XL LVL3 (GOWN DISPOSABLE) ×2 IMPLANT
GOWN STRL REUS W/TWL LRG LVL3 (GOWN DISPOSABLE) ×2
GOWN STRL REUS W/TWL XL LVL3 (GOWN DISPOSABLE) ×4
NDL SAFETY ECLIPSE 18X1.5 (NEEDLE) IMPLANT
NEEDLE HYPO 18GX1.5 SHARP (NEEDLE)
NEEDLE HYPO 25X1 1.5 SAFETY (NEEDLE) ×2 IMPLANT
NS IRRIG 1000ML POUR BTL (IV SOLUTION) ×2 IMPLANT
PACK BASIN DAY SURGERY FS (CUSTOM PROCEDURE TRAY) ×2 IMPLANT
PAD CAST 4YDX4 CTTN HI CHSV (CAST SUPPLIES) ×1 IMPLANT
PADDING CAST COTTON 4X4 STRL (CAST SUPPLIES) ×2
PENCIL SMOKE EVACUATOR (MISCELLANEOUS) ×2 IMPLANT
SANITIZER HAND PURELL 535ML FO (MISCELLANEOUS) ×2 IMPLANT
SHEET MEDIUM DRAPE 40X70 STRL (DRAPES) ×2 IMPLANT
SLEEVE SCD COMPRESS KNEE MED (STOCKING) ×2 IMPLANT
SPONGE LAP 18X18 RF (DISPOSABLE) ×2 IMPLANT
STOCKINETTE 6  STRL (DRAPES) ×1
STOCKINETTE 6 STRL (DRAPES) ×1 IMPLANT
SUCTION FRAZIER HANDLE 10FR (MISCELLANEOUS) ×1
SUCTION TUBE FRAZIER 10FR DISP (MISCELLANEOUS) ×1 IMPLANT
SUT ETHILON 2 0 FS 18 (SUTURE) IMPLANT
SUT ETHILON 2 0 FSLX (SUTURE) IMPLANT
SUT ETHILON 3 0 PS 1 (SUTURE) ×2 IMPLANT
SUT MNCRL AB 3-0 PS2 18 (SUTURE) IMPLANT
SWAB COLLECTION DEVICE MRSA (MISCELLANEOUS) IMPLANT
SWAB CULTURE ESWAB REG 1ML (MISCELLANEOUS) IMPLANT
SYR BULB EAR ULCER 3OZ GRN STR (SYRINGE) ×2 IMPLANT
SYR CONTROL 10ML LL (SYRINGE) ×2 IMPLANT
TOWEL GREEN STERILE FF (TOWEL DISPOSABLE) ×2 IMPLANT
TUBE CONNECTING 20X1/4 (TUBING) ×2 IMPLANT
UNDERPAD 30X36 HEAVY ABSORB (UNDERPADS AND DIAPERS) ×2 IMPLANT

## 2020-07-14 NOTE — Discharge Instructions (Addendum)
Steven Hewitt, MD [facility]  Please read the following information regarding your care after surgery.  Medications  You only need a prescription for the narcotic pain medicine (ex. oxycodone, Percocet, Norco).  All of the other medicines listed below are available over the counter. X Aleve 2 pills twice a day for the first 3 days after surgery. X acetominophen (Tylenol) 650 mg every 4-6 hours as you need for minor to moderate pain X oxycodone as prescribed for severe pain  Narcotic pain medicine (ex. oxycodone, Percocet, Vicodin) will cause constipation.  To prevent this problem, take the following medicines while you are taking any pain medicine. X docusate sodium (Colace) 100 mg twice a day X senna (Senokot) 2 tablets twice a day  Weight Bearing X Bear weight only on your operated foot in the post-op shoe.   Cast / Splint / Dressing X Keep your splint, cast or dressing clean and dry.  Don't put anything (coat hanger, pencil, etc) down inside of it.  If it gets damp, use a hair dryer on the cool setting to dry it.  If it gets soaked, call the office to schedule an appointment for a cast change.   After your dressing, cast or splint is removed; you may shower, but do not soak or scrub the wound.  Allow the water to run over it, and then gently pat it dry.  Swelling It is normal for you to have swelling where you had surgery.  To reduce swelling and pain, keep your toes above your nose for at least 3 days after surgery.  It may be necessary to keep your foot or leg elevated for several weeks.  If it hurts, it should be elevated.  Follow Up Call my office at [phone] when you are discharged from the hospital or surgery center to schedule an appointment to be seen two weeks after surgery.  Call my office at [phone] if you develop a fever >101.5 F, nausea, vomiting, bleeding from the surgical site or severe pain.     Post Anesthesia Home Care Instructions  Activity: Get  plenty of rest for the remainder of the day. A responsible individual must stay with you for 24 hours following the procedure.  For the next 24 hours, DO NOT: -Drive a car -Operate machinery -Drink alcoholic beverages -Take any medication unless instructed by your physician -Make any legal decisions or sign important papers.  Meals: Start with liquid foods such as gelatin or soup. Progress to regular foods as tolerated. Avoid greasy, spicy, heavy foods. If nausea and/or vomiting occur, drink only clear liquids until the nausea and/or vomiting subsides. Call your physician if vomiting continues.  Special Instructions/Symptoms: Your throat may feel dry or sore from the anesthesia or the breathing tube placed in your throat during surgery. If this causes discomfort, gargle with warm salt water. The discomfort should disappear within 24 hours.  If you had a scopolamine patch placed behind your ear for the management of post- operative nausea and/or vomiting:  1. The medication in the patch is effective for 72 hours, after which it should be removed.  Wrap patch in a tissue and discard in the trash. Wash hands thoroughly with soap and water. 2. You may remove the patch earlier than 72 hours if you experience unpleasant side effects which may include dry mouth, dizziness or visual disturbances. 3. Avoid touching the patch. Wash your hands with soap and water after contact with the patch.  Regional Anesthesia Blocks  1. Numbness or   the inability to move the "blocked" extremity may last from 3-48 hours after placement. The length of time depends on the medication injected and your individual response to the medication. If the numbness is not going away after 48 hours, call your surgeon.  2. The extremity that is blocked will need to be protected until the numbness is gone and the  Strength has returned. Because you cannot feel it, you will need to take extra care to avoid injury. Because it may be  weak, you may have difficulty moving it or using it. You may not know what position it is in without looking at it while the block is in effect.  3. For blocks in the legs and feet, returning to weight bearing and walking needs to be done carefully. You will need to wait until the numbness is entirely gone and the strength has returned. You should be able to move your leg and foot normally before you try and bear weight or walk. You will need someone to be with you when you first try to ensure you do not fall and possibly risk injury.  4. Bruising and tenderness at the needle site are common side effects and will resolve in a few days.  5. Persistent numbness or new problems with movement should be communicated to the surgeon or the [facility] ([phone])/ [facility] ([phone]).        

## 2020-07-14 NOTE — H&P (Signed)
Steven Nielsen is an 60 y.o. male.   Chief Complaint:  Left foot diabetic ulcer HPI: The patient is a 60 year old male with past medical history significant for diabetes.  He developed an ulcer at the left hallux many months ago.  He has been treated by podiatry in [place] with serial debridement.  An MRI shows osteomyelitis of his distal phalanx.  He presents now for surgical treatment of this chronic diabetic ulcer with underlying bone infection.  Past Medical History:  Diagnosis Date  . Diabetes mellitus without complication (HCC)   . GERD (gastroesophageal reflux disease)   . Hypertension   . Myositis 06/23/2020  . Osteomyelitis of great toe of left foot (HCC) 06/06/2020  . Sleep apnea    uses CPAP nightly  . Tongue swelling 06/23/2020    Past Surgical History:  Procedure Laterality Date  . APPENDECTOMY    . DIAGNOSTIC LAPAROSCOPY     exploratory  . IR FLUORO GUIDE CV LINE RIGHT  06/09/2020  . SHOULDER ARTHROSCOPY      Family History  Problem Relation Age of Onset  . Hyperlipidemia Mother   . Stroke Father   . Hyperlipidemia Father   . Diabetes Father   . Cancer Father   . Hyperlipidemia Sister   . Diabetes Sister   . Cancer Paternal Uncle        colon  . Stroke Paternal Grandmother   . Hypertension Paternal Grandfather   . Diabetes Paternal Grandfather    Social History:  reports that he has never smoked. He has never used smokeless tobacco. He reports previous alcohol use. He reports that he does not use drugs.  Allergies:  Allergies  Allergen Reactions  . Ceftriaxone Other (See Comments)    Tongue swelling, lip swelling with rocephin and dapto  . Daptomycin     Elevated CK myositis, ? Allergic rxn tongue and lip swelling  . Sumatriptan Rash and Shortness Of Breath    Flushing, diaphoresis, & shortness of breath  . Amoxicillin-Pot Clavulanate Diarrhea    Nausea, diarrhea, headache    Medications Prior to Admission  Medication Sig Dispense Refill  .  Acetaminophen (TYLENOL 8 HOUR ARTHRITIS PAIN PO)     . amLODipine (NORVASC) 5 MG tablet Take 5 mg by mouth at bedtime.    Marland Kitchen ascorbic acid (VITAMIN C) 500 MG tablet Take by mouth.    . Cholecalciferol 125 MCG (5000 UT) TABS Take by mouth.    . DULoxetine (CYMBALTA) 30 MG capsule TAKE 1 CAPSULE(30 MG) BY MOUTH EVERY DAY    . gabapentin (NEURONTIN) 400 MG capsule Take 400 mg by mouth daily.    Marland Kitchen losartan (COZAAR) 100 MG tablet Take 100 mg by mouth daily.    . metFORMIN (GLUCOPHAGE-XR) 750 MG 24 hr tablet Take 750 mg by mouth daily.    . Omega-3 Fatty Acids (FISH OIL) 1000 MG CAPS Take 1,000 mg by mouth daily.    . Omeprazole 20 MG TBDD     . Oxymetazoline HCl (NASAL SPRAY) 0.05 % SOLN Place into the nose.    . baclofen (LIORESAL) 10 MG tablet Take 10 mg by mouth 2 (two) times daily.    . butalbital-acetaminophen-caffeine (FIORICET) 50-325-40 MG tablet Take by mouth.    . ondansetron (ZOFRAN) 4 MG tablet Take 1 tablet (4 mg total) by mouth every 8 (eight) hours as needed for nausea or vomiting. 20 tablet 1    Results for orders placed or performed during the hospital encounter of 07/14/20 (from  the past 48 hour(s))  Glucose, capillary     Status: Abnormal   Collection Time: 07-29-2020  9:13 AM  Result Value Ref Range   Glucose-Capillary 154 (H) 70 - 99 mg/dL    Comment: Glucose reference range applies only to samples taken after fasting for at least 8 hours.   No results found.  Review of Systems no recent fever, chills, nausea, vomiting or changes in his appetite  Blood pressure (!) 155/85, pulse 76, temperature 98.5 F (36.9 C), temperature source Oral, resp. rate 18, height 6\' 1"  (1.854 m), weight 120 kg, SpO2 97 %. Physical Exam  Well-nourished well-developed man in no apparent distress.  Alert and oriented.  Normal mood and affect.  Gait is normal.  The left hallux has a plantar medial ulcer.  No cellulitis is evident.   Assessment/Plan Left hallux chronic diabetic ulcer with  osteomyelitis -to the operating room today for left hallux amputation through the proximal phalanx.The risks and benefits of the alternative treatment options have been discussed in detail.  The patient wishes to proceed with surgery and specifically understands risks of bleeding, infection, nerve damage, blood clots, need for additional surgery, amputation and death.   , MD 29-Jul-2020, 10:05 AM

## 2020-07-14 NOTE — Transfer of Care (Signed)
Immediate Anesthesia Transfer of Care Note  Patient: Steven Nielsen  Procedure(s) Performed: Left hallux amputation through proximal phalanx (Left Toe)  Patient Location: PACU  Anesthesia Type:MAC  Level of Consciousness: awake, alert , oriented and patient cooperative  Airway & Oxygen Therapy: Patient Spontanous Breathing and Patient connected to face mask oxygen  Post-op Assessment: Report given to RN and Post -op Vital signs reviewed and stable  Post vital signs: Reviewed and stable  Last Vitals:  Vitals Value Taken Time  BP    Temp    Pulse 64 07/14/20 1057  Resp 16 07/14/20 1057  SpO2 99 % 07/14/20 1057  Vitals shown include unvalidated device data.  Last Pain:  Vitals:   07/14/20 0908  TempSrc: Oral  PainSc: 0-No pain      Patients Stated Pain Goal: 3 (49/97/18 2099)  Complications: No complications documented.

## 2020-07-14 NOTE — Anesthesia Postprocedure Evaluation (Signed)
Anesthesia Post Note  Patient: Steven Nielsen  Procedure(s) Performed: Left hallux amputation through proximal phalanx (Left Toe)     Patient location during evaluation: PACU Anesthesia Type: MAC Level of consciousness: awake and alert Pain management: pain level controlled Vital Signs Assessment: post-procedure vital signs reviewed and stable Respiratory status: spontaneous breathing, nonlabored ventilation, respiratory function stable and patient connected to nasal cannula oxygen Cardiovascular status: stable and blood pressure returned to baseline Postop Assessment: no apparent nausea or vomiting Anesthetic complications: no   No complications documented.  Last Vitals:  Vitals:   07/14/20 1115 07/14/20 1147  BP: 121/78 124/75  Pulse: 78 63  Resp: 20 16  Temp:  36.5 C  SpO2: 98% 97%    Last Pain:  Vitals:   07/14/20 1147  TempSrc:   PainSc: 0-No pain                 Effie Berkshire

## 2020-07-14 NOTE — Anesthesia Procedure Notes (Signed)
Procedure Name: MAC Date/Time: 07/14/2020 10:44 AM Performed by: Signe Colt, CRNA Pre-anesthesia Checklist: Patient identified, Emergency Drugs available, Suction available, Patient being monitored and Timeout performed Patient Re-evaluated:Patient Re-evaluated prior to induction Oxygen Delivery Method: Simple face mask

## 2020-07-14 NOTE — Op Note (Signed)
07/14/2020  10:55 AM  PATIENT:  Steven Nielsen  60 y.o. male  PRE-OPERATIVE DIAGNOSIS: Left hallux diabetic ulcer and osteomyelitis  POST-OPERATIVE DIAGNOSIS: Same  Procedure(s): Left hallux amputation through the proximal phalanx  SURGEON:  Wylene Simmer, MD  ASSISTANT: Mechele Claude, PA-C  ANESTHESIA:   Local, MAC  EBL:  minimal   TOURNIQUET: 10 minutes with an ankle Esmarch tourniquet  COMPLICATIONS:  None apparent  DISPOSITION:  Extubated, awake and stable to recovery.  INDICATION FOR PROCEDURE: The patient is a 60 year old male with a past medical history significant for diabetes.  He has a long history of a left hallux plantar medial ulcer that has not healed despite months of podiatric treatment.  He has developed osteomyelitis of the underlying bone.  He presents now for operative treatment of this nonhealing diabetic foot ulcer and underlying bone infection.  The risks and benefits of the alternative treatment options have been discussed in detail.  The patient wishes to proceed with surgery and specifically understands risks of bleeding, infection, nerve damage, blood clots, need for additional surgery, amputation and death.  PROCEDURE IN DETAIL: After preoperative consent was obtained and the correct operative site was identified, the patient was brought to the operating room and placed supine on the operating table.  IV sedation was administered followed by a metatarsal block with Marcaine with epinephrine.  Surgical timeout was taken.  Preoperative antibiotics were administered.  The left lower extremity was prepped and draped in standard sterile fashion.  The foot was exsanguinated and a 4 inch Esmarch tourniquet wrapped around the ankle.  A fishmouth incision was marked on the skin proximal to the plantar ulcer of the left hallux.  The incision was made and dissection was carried sharply down through the subcutaneous tissues to the bone of the proximal phalanx.  Subperiosteal  dissection was then carried proximally to the level of the metaphyseal diaphyseal junction.  The oscillating saw was used to cut through the proximal phalanx, and the toe was passed off the field.  The wound was irrigated copiously.  Neurovascular bundles were cauterized.  Vancomycin powder was sprinkled in the wound.  The remaining soft tissues and bone all appeared healthy.  The wound was then closed with horizontal mattress and simple sutures of 3-0 nylon.  Sterile dressings were applied.  Followed by a well-padded compression wrap.  Tourniquet was released after application of the dressings.  The patient was awakened from anesthesia and transported to the recovery room in stable condition.   FOLLOW UP PLAN: Weightbearing as tolerated in a flat postop shoe.  Follow-up in the office in 2 weeks for suture removal.   Mechele Claude PA-C was present and scrubbed for the duration of the operative case. His assistance was essential in positioning the patient, prepping and draping, gaining and maintaining exposure, performing the operation, closing and dressing the wounds and applying the splint.

## 2020-07-14 NOTE — Anesthesia Preprocedure Evaluation (Addendum)
Anesthesia Evaluation  Patient identified by MRN, date of birth, ID band Patient awake    Reviewed: Allergy & Precautions, NPO status , Patient's Chart, lab work & pertinent test results  Airway Mallampati: II  TM Distance: >3 FB Neck ROM: Full    Dental  (+) Chipped, Dental Advisory Given   Pulmonary sleep apnea ,    breath sounds clear to auscultation       Cardiovascular hypertension, Pt. on medications  Rhythm:Regular Rate:Normal     Neuro/Psych  Neuromuscular disease negative psych ROS   GI/Hepatic Neg liver ROS, GERD  Medicated,  Endo/Other  diabetes  Renal/GU negative Renal ROS     Musculoskeletal  (+) Arthritis ,   Abdominal Normal abdominal exam  (+)   Peds  Hematology negative hematology ROS (+)   Anesthesia Other Findings   Reproductive/Obstetrics                            Anesthesia Physical Anesthesia Plan  ASA: II  Anesthesia Plan: MAC   Post-op Pain Management:    Induction: Intravenous  PONV Risk Score and Plan: 2 and Ondansetron and Propofol infusion  Airway Management Planned: Natural Airway and Simple Face Mask  Additional Equipment: None  Intra-op Plan:   Post-operative Plan:   Informed Consent: I have reviewed the patients History and Physical, chart, labs and discussed the procedure including the risks, benefits and alternatives for the proposed anesthesia with the patient or authorized representative who has indicated his/her understanding and acceptance.       Plan Discussed with: CRNA  Anesthesia Plan Comments:        Anesthesia Quick Evaluation

## 2020-07-15 ENCOUNTER — Encounter (HOSPITAL_BASED_OUTPATIENT_CLINIC_OR_DEPARTMENT_OTHER): Payer: Self-pay | Admitting: Orthopedic Surgery

## 2020-07-19 NOTE — OR Nursing (Signed)
No specimen sent per verbal order of Dr Victorino Dike.

## 2020-08-24 ENCOUNTER — Telehealth: Payer: Self-pay

## 2020-08-24 ENCOUNTER — Telehealth: Payer: Self-pay | Admitting: *Deleted

## 2020-08-24 NOTE — Telephone Encounter (Addendum)
"  I am looking to speak to [facility].  She was supposed to have gotten back in contact with me in regards to what her contacts from [facility] said in response to our formal complaint that I submitted.  I haven't heard from her since then.  I wanted to see what she says before we proceed.  We're looking into legal options.  My husband had to have his toe amputated back in June due to the negligence."  I will give [facility] your message.

## 2020-08-24 NOTE — Telephone Encounter (Signed)
I spoke with patient wife Elnita Maxwell today, she explained that the patient had an amputation of his left hallux. Elnita Maxwell stated that the only treatment for osteomyelitis is amputation.  She was still very upset about being treated for 6 months without additional imaging or lab work being ordered. I asked the patient wife, were they looking to legal and she stated yes they have an attorney.

## 2021-07-06 ENCOUNTER — Other Ambulatory Visit: Payer: Self-pay | Admitting: Family Medicine

## 2021-07-06 DIAGNOSIS — N50812 Left testicular pain: Secondary | ICD-10-CM

## 2021-07-14 ENCOUNTER — Ambulatory Visit
Admission: RE | Admit: 2021-07-14 | Discharge: 2021-07-14 | Disposition: A | Discharge status: Home / Self Care | Payer: Managed Care, Other (non HMO) | Source: Ambulatory Visit | Attending: Family Medicine | Admitting: Family Medicine

## 2021-07-14 DIAGNOSIS — N50812 Left testicular pain: Secondary | ICD-10-CM | POA: Insufficient documentation

## 2021-07-18 ENCOUNTER — Encounter: Payer: Self-pay | Admitting: Urology

## 2021-07-18 ENCOUNTER — Ambulatory Visit (INDEPENDENT_AMBULATORY_CARE_PROVIDER_SITE_OTHER): Payer: Managed Care, Other (non HMO) | Admitting: Urology

## 2021-07-18 ENCOUNTER — Other Ambulatory Visit: Payer: Self-pay | Admitting: Urology

## 2021-07-18 ENCOUNTER — Telehealth: Payer: Self-pay

## 2021-07-18 VITALS — BP 141/82 | HR 72 | Ht 73.0 in | Wt 266.0 lb

## 2021-07-18 DIAGNOSIS — N4342 Spermatocele of epididymis, multiple: Secondary | ICD-10-CM | POA: Diagnosis not present

## 2021-07-18 NOTE — Progress Notes (Signed)
   07/18/2021 10:41 AM   Piedad Climes [place] February 27, 1960 [id number]  Reason for visit: Left spermatocele  HPI: 61 year old male who I saw previously in 2021 for gross hematuria and underwent a negative CT and cystoscopy.  He reports at least a few weeks of some left scrotal swelling that has been uncomfortable and painful.  He denies any urinary symptoms.  I personally viewed and interpreted the scrotal ultrasound dated 07/14/2021 that shows multiple left epididymal cysts consistent with a spermatocele, measuring up to 4 cm.  No solid testicular masses.  On exam, there is a 4 cm tender left spermatocele, no other suspicious findings.  We discussed options including observation versus spermatocelectomy.  He is interested in surgery with his discomfort from the cysts.  We reviewed this is a 30-minute procedure performed with general anesthesia, and involves a small incision in the left hemiscrotum and removal of the cyst.  These are benign lesions.  There is a less than 5% risk of bleeding or infection, or recurrence of cyst in the future.  It can take 4 to 6 weeks for complete healing, and we recommend avoiding strenuous physical activity for around 1 to 2 weeks after surgery.  Schedule left spermatocelectomy  Sondra Come, MD  [facility] [address], [address] [place], [place] [address] [phone]

## 2021-07-18 NOTE — Progress Notes (Signed)
Surgical Physician Order Form [facility] [place]  * Scheduling expectation : Next Available  *Length of Case: 1 hour  *Clearance needed: no  *Anticoagulation Instructions: Hold all anticoagulants  *Aspirin Instructions: Hold Aspirin  *Post-op visit Date/Instructions: 6 weeks with PA wound check  *Diagnosis: Left spermatocele  *Procedure: left spermatocele ectomy   Additional orders: N/A  -Admit type: OUTpatient  -Anesthesia: General  -VTE Prophylaxis Standing Order SCD's       Other:   -Standing Lab Orders Per Anesthesia    Lab other: None  -Standing Test orders EKG/Chest x-ray per Anesthesia       Test other:   - Medications:  Cipro 400mg  IV  -Other orders:  N/A

## 2021-07-18 NOTE — Progress Notes (Signed)
[facility] Posting Form   Surgery Date/Time: Date: 07/28/2021  Surgeon: Dr. Legrand Rams, MD   Surgery Location: Day Surgery  Inpt ( No  )   Outpt (Yes)   Obs ( No  )   Diagnosis: N43.42 Left Spermatocele  -CPT: [phone]  Surgery: Left Spermatocelectomy   Stop Anticoagulations: Yes and hold ASA  Cardiac/Medical/Pulmonary Clearance needed: no  *Orders entered into EPIC  Date: 07/18/21   *Case booked in EPIC  Date: 07/18/21  *Notified pt of Surgery: Date: 07/18/21  PRE-OP UA & CX: no  *Placed into Prior Authorization Work Que Date: 07/18/21   Assistant/laser/rep:No

## 2021-07-18 NOTE — Telephone Encounter (Signed)
I spoke with Steven Nielsen. We have discussed possible surgery dates and Friday June 16th, 2023 was agreed upon by all parties. Patient given information about surgery date, what to expect pre-operatively and post operatively.  We discussed that a Pre-Admission Testing office will be calling to set up the pre-op visit that will take place prior to surgery, and that these appointments are typically done over the phone with a Pre-Admissions RN.  Informed patient that our office will communicate any additional care to be provided after surgery. Patients questions or concerns were discussed during our call. Advised to call our office should there be any additional information, questions or concerns that arise. Patient verbalized understanding.

## 2021-07-18 NOTE — Patient Instructions (Signed)
Spermatocele  A spermatocele is a fluid-filled sac (cyst) inside the sac that holds the testicles (scrotum). This type of cyst often forms in the epididymis. The epididymis is a coiled tube at the top of each testicle, and this tube is where sperm are stored. The cyst sometimes forms along a tube called the vas deferens, which is a tube that carries sperm away from the epididymis. Spermatoceles are usually painless. Most cysts are small, but they can grow larger. Spermatoceles are not cancerous (are benign). What are the causes? The cause of this condition is not known. However, this condition usually results from a blockage in one of the many small tubes (tubules) that carry sperm from your testicle to your vas deferens. What are the signs or symptoms? In most cases, small cysts do not cause symptoms. However, symptoms sometimes occur. Symptoms of this condition include: Dull pain. A feeling of heaviness. An enlarged scrotum, if your cyst is large. How is this diagnosed? This condition is diagnosed based on a physical exam. You or your health care provider may notice your cyst when feeling your scrotum. Your health care provider may shine a light through (transilluminate) your scrotum to see if light will pass through your cyst. You may have an ultrasound of the scrotum to rule out a tumor. How is this treated? Small spermatoceles do not need to be treated. If your spermatocele has grown large or is uncomfortable, your health care provider may recommend surgery to remove it. Follow these instructions at home: Check your spermatocele regularly for any changes. Do regular self-exams of your scrotum. Keep all follow-up visits. This is important. Contact a health care provider if: Your spermatocele gets larger. You have pain in your scrotum. Your spermatocele comes back after treatment. Get help right away if: You experience severe pain and redness of your scrotum. Summary A spermatocele  is a fluid-filled sac, or a cyst, inside the sac that holds the testicles (scrotum). This condition is usually painless, and it is not cancerous (is benign). Your health care provider may recommend surgery to remove your spermatocele if it grows large or is uncomfortable. If you have a spermatocele, check for any changes and do self-exams of your scrotum. Keep all follow-up visits. This is important. This information is not intended to replace advice given to you by your health care provider. Make sure you discuss any questions you have with your health care provider. Document Revised: 09/19/2020 Document Reviewed: 09/19/2020 Elsevier Patient Education  2023 [employer]   

## 2021-07-24 ENCOUNTER — Encounter
Admission: RE | Admit: 2021-07-24 | Discharge: 2021-07-24 | Disposition: A | Discharge status: Home / Self Care | Payer: Managed Care, Other (non HMO) | Source: Ambulatory Visit | Attending: Urology | Admitting: Urology

## 2021-07-24 ENCOUNTER — Encounter: Payer: Self-pay | Admitting: Urgent Care

## 2021-07-24 VITALS — Ht 73.0 in | Wt 266.0 lb

## 2021-07-24 DIAGNOSIS — E119 Type 2 diabetes mellitus without complications: Secondary | ICD-10-CM | POA: Insufficient documentation

## 2021-07-24 DIAGNOSIS — I10 Essential (primary) hypertension: Secondary | ICD-10-CM | POA: Diagnosis not present

## 2021-07-24 DIAGNOSIS — Z01810 Encounter for preprocedural cardiovascular examination: Secondary | ICD-10-CM | POA: Diagnosis not present

## 2021-07-24 DIAGNOSIS — Z01818 Encounter for other preprocedural examination: Secondary | ICD-10-CM | POA: Diagnosis present

## 2021-07-24 DIAGNOSIS — Z01812 Encounter for preprocedural laboratory examination: Secondary | ICD-10-CM

## 2021-07-24 HISTORY — DX: Other intervertebral disc degeneration, lumbar region: M51.36

## 2021-07-24 HISTORY — DX: Restless legs syndrome: G25.81

## 2021-07-24 HISTORY — DX: Other intervertebral disc degeneration, lumbar region without mention of lumbar back pain or lower extremity pain: M51.369

## 2021-07-24 HISTORY — DX: Polyneuropathy, unspecified: G62.9

## 2021-07-24 HISTORY — DX: Carcinoma in situ of skin of right upper limb, including shoulder: D04.61

## 2021-07-24 LAB — CBC
HCT: 43.2 % (ref 39.0–52.0)
Hemoglobin: 15.3 g/dL (ref 13.0–17.0)
MCH: 30.5 pg (ref 26.0–34.0)
MCHC: 35.4 g/dL (ref 30.0–36.0)
MCV: 86.2 fL (ref 80.0–100.0)
Platelets: 326 10*3/uL (ref 150–400)
RBC: 5.01 MIL/uL (ref 4.22–5.81)
RDW: 12.1 % (ref 11.5–15.5)
WBC: 11.9 10*3/uL — ABNORMAL HIGH (ref 4.0–10.5)
nRBC: 0 % (ref 0.0–0.2)

## 2021-07-24 LAB — BASIC METABOLIC PANEL
Anion gap: 8 (ref 5–15)
BUN: 13 mg/dL (ref 6–20)
CO2: 26 mmol/L (ref 22–32)
Calcium: 9.4 mg/dL (ref 8.9–10.3)
Chloride: 103 mmol/L (ref 98–111)
Creatinine, Ser: 0.9 mg/dL (ref 0.61–1.24)
GFR, Estimated: >60 mL/min (ref >60)
Glucose, Bld: 112 mg/dL — ABNORMAL HIGH (ref 70–99)
Potassium: 3.6 mmol/L (ref 3.5–5.1)
Sodium: 137 mmol/L (ref 135–145)

## 2021-07-24 NOTE — Patient Instructions (Addendum)
Your procedure is scheduled on: Friday, June 16 Report to the Registration Desk on the 1st floor of the [facility]. To find out your arrival time, please call [phone] between 1PM - 3PM on: Thursday, June 15 If your arrival time is 6:00 am, do not arrive prior to that time as the [facility] entrance doors do not open until 6:00 am.  REMEMBER: Instructions that are not followed completely may result in serious medical risk, up to and including death; or upon the discretion of your surgeon and anesthesiologist your surgery may need to be rescheduled.  Do not eat or drink after midnight the night before surgery.  No gum chewing, lozengers or hard candies.  TAKE THESE MEDICATIONS THE MORNING OF SURGERY WITH A SIP OF WATER:  Omeprazole - (take one the night before and one on the morning of surgery - helps to prevent nausea after surgery.)  Stop Metformin 2 days prior to surgery. Last day to take Metformin is Tuesday, June 13. Resume AFTER surgery.  One week prior to surgery: starting today, June 12 Stop Anti-inflammatories (NSAIDS) such as Advil, Aleve, Ibuprofen, Motrin, Naproxen, Naprosyn and Aspirin based products such as Excedrin, Goodys Powder, BC Powder. Stop ANY OVER THE COUNTER supplements until after surgery. Stop Vitamin C, omega-3 fish oil, saw palmetto. You may however, continue to take Tylenol if needed for pain up until the day of surgery.  No Alcohol for 24 hours before or after surgery.  No Smoking including e-cigarettes for 24 hours prior to surgery.  No chewable tobacco products for at least 6 hours prior to surgery.  No nicotine patches on the day of surgery.  Do not use any "recreational" drugs for at least a week prior to your surgery.  Please be advised that the combination of cocaine and anesthesia may have negative outcomes, up to and including death. If you test positive for cocaine, your surgery will be cancelled.  On the morning of surgery brush your  teeth with toothpaste and water, you may rinse your mouth with mouthwash if you wish. Do not swallow any toothpaste or mouthwash.  Do not wear jewelry, make-up, hairpins, clips or nail polish.  Do not wear lotions, powders, or perfumes.   Do not shave body from the neck down 48 hours prior to surgery just in case you cut yourself which could leave a site for infection.   Contact lenses, hearing aids and dentures may not be worn into surgery.  Do not bring valuables to the hospital. [facility] is not responsible for any missing/lost belongings or valuables.   Bring your C-PAP to the hospital with you in case you may have to spend the night.   Notify your doctor if there is any change in your medical condition (cold, fever, infection).  Wear comfortable clothing (specific to your surgery type) to the hospital.  After surgery, you can help prevent lung complications by doing breathing exercises.  Take deep breaths and cough every 1-2 hours. Your doctor may order a device called an Incentive Spirometer to help you take deep breaths.  If you are being discharged the day of surgery, you will not be allowed to drive home. You will need a responsible adult (18 years or older) to drive you home and stay with you that night.   If you are taking public transportation, you will need to have a responsible adult (18 years or older) with you. Please confirm with your physician that it is acceptable to use public  transportation.   Please call the [place] [facility] at [phone] if you have any questions about these instructions.  Surgery Visitation Policy:  Patients undergoing a surgery or procedure may have two family members or support persons with them as long as the person is not COVID-19 positive or experiencing its symptoms.

## 2021-07-28 ENCOUNTER — Encounter
Admission: RE | Disposition: A | Discharge status: Home / Self Care | Payer: Self-pay | Source: Ambulatory Visit | Attending: Urology

## 2021-07-28 ENCOUNTER — Ambulatory Visit: Payer: Managed Care, Other (non HMO) | Admitting: Anesthesiology

## 2021-07-28 ENCOUNTER — Other Ambulatory Visit: Payer: Self-pay

## 2021-07-28 ENCOUNTER — Ambulatory Visit
Admission: RE | Admit: 2021-07-28 | Discharge: 2021-07-28 | Disposition: A | Discharge status: Home / Self Care | Payer: Managed Care, Other (non HMO) | Source: Ambulatory Visit | Attending: Urology | Admitting: Urology

## 2021-07-28 ENCOUNTER — Encounter: Payer: Self-pay | Admitting: Urology

## 2021-07-28 DIAGNOSIS — Z01812 Encounter for preprocedural laboratory examination: Secondary | ICD-10-CM

## 2021-07-28 DIAGNOSIS — I10 Essential (primary) hypertension: Secondary | ICD-10-CM | POA: Diagnosis not present

## 2021-07-28 DIAGNOSIS — N4342 Spermatocele of epididymis, multiple: Secondary | ICD-10-CM

## 2021-07-28 DIAGNOSIS — Z6835 Body mass index (BMI) 35.0-35.9, adult: Secondary | ICD-10-CM | POA: Insufficient documentation

## 2021-07-28 DIAGNOSIS — Z79899 Other long term (current) drug therapy: Secondary | ICD-10-CM | POA: Diagnosis not present

## 2021-07-28 DIAGNOSIS — Z87891 Personal history of nicotine dependence: Secondary | ICD-10-CM | POA: Diagnosis not present

## 2021-07-28 DIAGNOSIS — N433 Hydrocele, unspecified: Secondary | ICD-10-CM | POA: Diagnosis not present

## 2021-07-28 DIAGNOSIS — Z7984 Long term (current) use of oral hypoglycemic drugs: Secondary | ICD-10-CM | POA: Insufficient documentation

## 2021-07-28 DIAGNOSIS — G4730 Sleep apnea, unspecified: Secondary | ICD-10-CM | POA: Diagnosis not present

## 2021-07-28 DIAGNOSIS — E119 Type 2 diabetes mellitus without complications: Secondary | ICD-10-CM | POA: Diagnosis not present

## 2021-07-28 DIAGNOSIS — K219 Gastro-esophageal reflux disease without esophagitis: Secondary | ICD-10-CM | POA: Diagnosis not present

## 2021-07-28 DIAGNOSIS — N4341 Spermatocele of epididymis, single: Secondary | ICD-10-CM | POA: Diagnosis present

## 2021-07-28 HISTORY — PX: SPERMATOCELECTOMY: SHX2420

## 2021-07-28 LAB — GLUCOSE, CAPILLARY
Glucose-Capillary: 129 mg/dL — ABNORMAL HIGH (ref 70–99)
Glucose-Capillary: 153 mg/dL — ABNORMAL HIGH (ref 70–99)

## 2021-07-28 SURGERY — EXCISION, SPERMATOCELE
Anesthesia: General | Site: Scrotum | Laterality: Left

## 2021-07-28 MED ORDER — FENTANYL CITRATE (PF) 100 MCG/2ML IJ SOLN
INTRAMUSCULAR | Status: AC
  Administered 2021-07-28: 50 ug via INTRAVENOUS
  Filled 2021-07-28: qty 2

## 2021-07-28 MED ORDER — FENTANYL CITRATE (PF) 100 MCG/2ML IJ SOLN
INTRAMUSCULAR | Status: DC | PRN
  Administered 2021-07-28 (×2): 50 ug via INTRAVENOUS

## 2021-07-28 MED ORDER — DEXAMETHASONE SODIUM PHOSPHATE 10 MG/ML IJ SOLN
INTRAMUSCULAR | Status: DC | PRN
  Administered 2021-07-28: 10 mg via INTRAVENOUS

## 2021-07-28 MED ORDER — LIDOCAINE HCL (CARDIAC) PF 100 MG/5ML IV SOSY
PREFILLED_SYRINGE | INTRAVENOUS | Status: DC | PRN
  Administered 2021-07-28: 100 mg via INTRAVENOUS

## 2021-07-28 MED ORDER — MIDAZOLAM HCL 2 MG/2ML IJ SOLN
INTRAMUSCULAR | Status: DC | PRN
  Administered 2021-07-28: 2 mg via INTRAVENOUS

## 2021-07-28 MED ORDER — HYDROCODONE-ACETAMINOPHEN 5-325 MG PO TABS
1.0000 | ORAL_TABLET | Freq: Four times a day (QID) | ORAL | 0 refills | Status: AC | PRN
Start: 2021-07-28 — End: 2021-08-02

## 2021-07-28 MED ORDER — ORAL CARE MOUTH RINSE: 15.0000 mL | Freq: Once | OROMUCOSAL | Status: AC

## 2021-07-28 MED ORDER — ONDANSETRON HCL 4 MG/2ML IJ SOLN: 4.0000 mg | Freq: Once | INTRAMUSCULAR | Status: DC | PRN

## 2021-07-28 MED ORDER — CHLORHEXIDINE GLUCONATE 0.12 % MT SOLN: 15.0000 mL | Freq: Once | OROMUCOSAL | Status: AC

## 2021-07-28 MED ORDER — 0.9 % SODIUM CHLORIDE (POUR BTL) OPTIME
TOPICAL | Status: DC | PRN
  Administered 2021-07-28: 500 mL

## 2021-07-28 MED ORDER — CIPROFLOXACIN IN D5W 400 MG/200ML IV SOLN
INTRAVENOUS | Status: AC
  Filled 2021-07-28: qty 200

## 2021-07-28 MED ORDER — ACETAMINOPHEN 10 MG/ML IV SOLN
INTRAVENOUS | Status: DC | PRN
  Administered 2021-07-28: 1000 mg via INTRAVENOUS

## 2021-07-28 MED ORDER — BACITRACIN 500 UNIT/GM EX OINT
TOPICAL_OINTMENT | CUTANEOUS | Status: DC | PRN
  Administered 2021-07-28: 1 via TOPICAL

## 2021-07-28 MED ORDER — OXYCODONE HCL 5 MG PO TABS
10.0000 mg | ORAL_TABLET | Freq: Once | ORAL | Status: AC
  Administered 2021-07-28: 10 mg via ORAL

## 2021-07-28 MED ORDER — DEXMEDETOMIDINE (PRECEDEX) IN NS 20 MCG/5ML (4 MCG/ML) IV SYRINGE
PREFILLED_SYRINGE | INTRAVENOUS | Status: DC | PRN
  Administered 2021-07-28: 8 ug via INTRAVENOUS

## 2021-07-28 MED ORDER — SODIUM CHLORIDE 0.9 % IV SOLN: INTRAVENOUS | Status: DC

## 2021-07-28 MED ORDER — BACITRACIN ZINC 500 UNIT/GM EX OINT
TOPICAL_OINTMENT | CUTANEOUS | Status: AC
Start: 2021-07-28 — End: ?
  Filled 2021-07-28: qty 28.35

## 2021-07-28 MED ORDER — ACETAMINOPHEN 10 MG/ML IV SOLN
INTRAVENOUS | Status: AC
  Filled 2021-07-28: qty 100

## 2021-07-28 MED ORDER — MIDAZOLAM HCL 2 MG/2ML IJ SOLN
INTRAMUSCULAR | Status: AC
  Filled 2021-07-28: qty 2

## 2021-07-28 MED ORDER — BUPIVACAINE HCL 0.5 % IJ SOLN
INTRAMUSCULAR | Status: DC | PRN
  Administered 2021-07-28: 3.5 mL

## 2021-07-28 MED ORDER — OXYCODONE HCL 5 MG PO TABS
ORAL_TABLET | ORAL | Status: AC
  Filled 2021-07-28: qty 2

## 2021-07-28 MED ORDER — PROPOFOL 10 MG/ML IV BOLUS
INTRAVENOUS | Status: DC | PRN
  Administered 2021-07-28: 200 mg via INTRAVENOUS
  Administered 2021-07-28: 50 mg via INTRAVENOUS

## 2021-07-28 MED ORDER — ONDANSETRON HCL 4 MG/2ML IJ SOLN
INTRAMUSCULAR | Status: DC | PRN
  Administered 2021-07-28: 4 mg via INTRAVENOUS

## 2021-07-28 MED ORDER — BUPIVACAINE HCL (PF) 0.5 % IJ SOLN
INTRAMUSCULAR | Status: AC
  Filled 2021-07-28: qty 30

## 2021-07-28 MED ORDER — FENTANYL CITRATE (PF) 100 MCG/2ML IJ SOLN
INTRAMUSCULAR | Status: AC
  Filled 2021-07-28: qty 2

## 2021-07-28 MED ORDER — CIPROFLOXACIN IN D5W 400 MG/200ML IV SOLN
400.0000 mg | INTRAVENOUS | Status: AC
  Administered 2021-07-28: 400 mg via INTRAVENOUS

## 2021-07-28 MED ORDER — CHLORHEXIDINE GLUCONATE 0.12 % MT SOLN
OROMUCOSAL | Status: AC
  Administered 2021-07-28: 15 mL via OROMUCOSAL
  Filled 2021-07-28: qty 15

## 2021-07-28 MED ORDER — FENTANYL CITRATE (PF) 100 MCG/2ML IJ SOLN
25.0000 ug | INTRAMUSCULAR | Status: DC | PRN
  Administered 2021-07-28: 50 ug via INTRAVENOUS

## 2021-07-28 SURGICAL SUPPLY — 34 items
ADH SKN CLS APL DERMABOND .7 (GAUZE/BANDAGES/DRESSINGS) ×1
APL PRP STRL LF DISP 70% ISPRP (MISCELLANEOUS) ×1
BLADE SURG 15 STRL LF DISP TIS (BLADE) ×1 IMPLANT
BLADE SURG 15 STRL SS (BLADE) ×2
CHLORAPREP W/TINT 26 (MISCELLANEOUS) ×2 IMPLANT
DERMABOND ADVANCED (GAUZE/BANDAGES/DRESSINGS) ×1
DERMABOND ADVANCED .7 DNX12 (GAUZE/BANDAGES/DRESSINGS) ×1 IMPLANT
DRAPE LAPAROTOMY 77X122 PED (DRAPES) ×2 IMPLANT
DRSG GAUZE FLUFF 36X18 (GAUZE/BANDAGES/DRESSINGS) ×2 IMPLANT
DRSG TELFA 3X8 NADH (GAUZE/BANDAGES/DRESSINGS) ×2 IMPLANT
ELECT REM PT RETURN 9FT ADLT (ELECTROSURGICAL) ×2
ELECTRODE REM PT RTRN 9FT ADLT (ELECTROSURGICAL) ×1 IMPLANT
GAUZE 4X4 16PLY ~~LOC~~+RFID DBL (SPONGE) ×2 IMPLANT
GLOVE SURG UNDER POLY LF SZ7.5 (GLOVE) ×2 IMPLANT
GOWN STRL REUS W/ TWL LRG LVL3 (GOWN DISPOSABLE) ×1 IMPLANT
GOWN STRL REUS W/ TWL XL LVL3 (GOWN DISPOSABLE) ×1 IMPLANT
GOWN STRL REUS W/TWL LRG LVL3 (GOWN DISPOSABLE) ×2
GOWN STRL REUS W/TWL XL LVL3 (GOWN DISPOSABLE) ×2
KIT TURNOVER KIT A (KITS) ×2 IMPLANT
LABEL OR SOLS (LABEL) ×2 IMPLANT
MANIFOLD NEPTUNE II (INSTRUMENTS) ×2 IMPLANT
NDL HYPO 25X1 1.5 SAFETY (NEEDLE) ×1 IMPLANT
NEEDLE HYPO 25X1 1.5 SAFETY (NEEDLE) ×2 IMPLANT
NS IRRIG 1000ML POUR BTL (IV SOLUTION) ×2 IMPLANT
PACK BASIN MINOR ARMC (MISCELLANEOUS) ×2 IMPLANT
PAD DRESSING TELFA 3X8 NADH (GAUZE/BANDAGES/DRESSINGS) ×1 IMPLANT
PANTS MESH DISP 2XL (UNDERPADS AND DIAPERS) ×1 IMPLANT
PANTS MESH DISPOSABLE 2XL (UNDERPADS AND DIAPERS) ×1
SUT CHROMIC 3 0 SH 27 (SUTURE) ×2 IMPLANT
SUT VIC AB 3-0 SH 27 (SUTURE) ×2
SUT VIC AB 3-0 SH 27X BRD (SUTURE) ×1 IMPLANT
SYR 10ML LL (SYRINGE) ×2 IMPLANT
SYR BULB IRRIG 60ML STRL (SYRINGE) ×2 IMPLANT
WATER STERILE IRR 500ML POUR (IV SOLUTION) ×2 IMPLANT

## 2021-07-28 NOTE — Op Note (Signed)
Date of procedure: 07/28/21  Preoperative diagnosis:  Left spermatocele  Postoperative diagnosis:  Same  Procedure: Left spermatocele ectomy  Surgeon: Legrand Rams, MD  Anesthesia: General  Complications: None  Intraoperative findings:  Uncomplicated left spermatocelectomy, small reactive hydrocele  EBL: Minimal  Specimens: None  Drains: None  Indication: Steven Nielsen is a 61 y.o. patient with left spermatocele measuring 4 cm with bothersome discomfort and he desired surgical removal.  After reviewing the management options for treatment, they elected to proceed with the above surgical procedure(s). We have discussed the potential benefits and risks of the procedure, side effects of the proposed treatment, the likelihood of the patient achieving the goals of the procedure, and any potential problems that might occur during the procedure or recuperation. Informed consent has been obtained.  Description of procedure:  The patient was taken to the operating room and general anesthesia was induced. SCDs were placed for DVT prophylaxis. The patient was placed in the supine position, prepped and draped in the usual sterile fashion, and preoperative antibiotics(Ancef) were administered. A preoperative time-out was performed.   A 4 cm incision was made over the left hemiscrotum and dartos was opened with cautery.  There was a very small hydrocele.  The left testicle and spermatocele was delivered into the operative field.  The spermatocele measured about 4 cm, and appeared benign with no solid lesions or masses.  With a combination of careful dissection and cautery, the spermatocele was dissected intact off of the testicle and epididymis.  There was no significant bleeding noted.  Meticulous hemostasis was achieved.  A jaboule style hydrocele repair was performed with a running 3-0 Vicryl suture.  The wound was irrigated.  The testicle was returned to the scrotum in anatomic position.   Dartos was closed using a running 3-0 Vicryl.  The skin was closed with interrupted 3-0 chromic sutures.  Local anesthetic was injected alongside the incision.  Bacitracin, Telfa, fluffs and jockstrap were applied for dressing.  Disposition: Stable to PACU  Plan: Wound check in clinic in 2 months  Legrand Rams, MD

## 2021-07-28 NOTE — Discharge Instructions (Signed)

## 2021-07-28 NOTE — Anesthesia Procedure Notes (Signed)
Procedure Name: LMA Insertion Date/Time: 07/28/2021 3:15 PM  Performed by: Irving Burton, CRNAPre-anesthesia Checklist: Patient identified, Patient being monitored, Timeout performed, Emergency Drugs available and Suction available Patient Re-evaluated:Patient Re-evaluated prior to induction Oxygen Delivery Method: Circle system utilized Preoxygenation: Pre-oxygenation with 100% oxygen Induction Type: IV induction Ventilation: Mask ventilation without difficulty LMA: LMA inserted LMA Size: 4.5 Tube type: Oral Number of attempts: 1 Placement Confirmation: positive ETCO2 and breath sounds checked- equal and bilateral Tube secured with: Tape Dental Injury: Teeth and Oropharynx as per pre-operative assessment

## 2021-07-28 NOTE — Anesthesia Preprocedure Evaluation (Signed)
Anesthesia Evaluation  Patient identified by MRN, date of birth, ID band Patient awake    Reviewed: Allergy & Precautions, NPO status , Patient's Chart, lab work & pertinent test results  Airway Mallampati: II  TM Distance: >3 FB Neck ROM: Full    Dental  (+) Teeth Intact   Pulmonary neg pulmonary ROS, sleep apnea and Continuous Positive Airway Pressure Ventilation , former smoker,    Pulmonary exam normal breath sounds clear to auscultation       Cardiovascular Exercise Tolerance: Good hypertension, Pt. on medications negative cardio ROS Normal cardiovascular exam Rhythm:Regular     Neuro/Psych negative neurological ROS  negative psych ROS   GI/Hepatic negative GI ROS, Neg liver ROS,   Endo/Other  negative endocrine ROSdiabetes, Well Controlled, Type 2, Oral Hypoglycemic AgentsMorbid obesity  Renal/GU      Musculoskeletal negative musculoskeletal ROS (+)   Abdominal (+) + obese,   Peds negative pediatric ROS (+)  Hematology negative hematology ROS (+)   Anesthesia Other Findings Past Medical History: No date: DDD (degenerative disc disease), lumbar No date: Diabetes mellitus without complication (HCC)     Comment:  type 2 No date: GERD (gastroesophageal reflux disease) 2013: Gout No date: Hypertension 06/23/2020: Myositis No date: Neuropathy 06/06/2020: Osteomyelitis of great toe of left foot (HCC) No date: Restless leg syndrome No date: Sleep apnea     Comment:  uses CPAP nightly No date: 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, right 06/23/2020: Tongue swelling  Past Surgical History: 07/14/2020: AMPUTATION TOE; Left     Comment:  Procedure: Left hallux amputation through proximal               phalanx;  Surgeon: Toni Arthurs, MD;  Location: [facility]               [place];  Service: Orthopedics;  Laterality:               Left; 1985: APPENDECTOMY 11/16/2013:  COLONOSCOPY 12/22/2018: COLONOSCOPY 2010: DIAGNOSTIC LAPAROSCOPY     Comment:  exploratory 06/09/2020: IR FLUORO GUIDE CV LINE RIGHT 2013: SHOULDER ARTHROSCOPY; Left     Comment:  fracture; pins and screws 1970: TONSILLECTOMY     Reproductive/Obstetrics negative OB ROS                             Anesthesia Physical Anesthesia Plan  ASA: 3  Anesthesia Plan: General   Post-op Pain Management:    Induction: Intravenous  PONV Risk Score and Plan: Dexamethasone, Ondansetron, Midazolam and Treatment may vary due to age or medical condition  Airway Management Planned: LMA  Additional Equipment:   Intra-op Plan:   Post-operative Plan: Extubation in OR  Informed Consent: I have reviewed the patients History and Physical, chart, labs and discussed the procedure including the risks, benefits and alternatives for the proposed anesthesia with the patient or authorized representative who has indicated his/her understanding and acceptance.     Dental Advisory Given  Plan Discussed with: CRNA and Surgeon  Anesthesia Plan Comments:         Anesthesia Quick Evaluation

## 2021-07-28 NOTE — H&P (Signed)
07/28/21 3:02 PM   Steven Nielsen 05/23/1960 [id number]  CC: Left spermatocele  HPI: 61 year old male who I saw previously in 2021 for gross hematuria and underwent a negative CT and cystoscopy.  He reports at least a few weeks of some left scrotal swelling that has been uncomfortable and painful.  He denies any urinary symptoms.  I personally viewed and interpreted the scrotal ultrasound dated 07/14/2021 that shows multiple left epididymal cysts consistent with a spermatocele, measuring up to 4 cm.  No solid testicular masses.  On exam, there is a 4 cm tender left spermatocele, no other suspicious findings.     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, right    Tongue swelling 06/23/2020    Surgical History: Past Surgical History:  Procedure Laterality Date   AMPUTATION TOE Left 07/14/2020   Procedure: Left hallux amputation through proximal phalanx;  Surgeon: Toni Arthurs, MD;  Location: [facility];  Service: Orthopedics;  Laterality: Left;   APPENDECTOMY  1985   COLONOSCOPY  11/16/2013   COLONOSCOPY  12/22/2018   DIAGNOSTIC LAPAROSCOPY  2010   exploratory   IR FLUORO GUIDE CV LINE RIGHT  06/09/2020   SHOULDER ARTHROSCOPY Left 2013   fracture; pins and screws   TONSILLECTOMY  1970      Family History: Family History  Problem Relation Age of Onset   Hyperlipidemia Mother    Stroke Father    Hyperlipidemia Father    Diabetes Father    Cancer Father    Hyperlipidemia Sister    Diabetes Sister    Cancer Paternal Uncle        colon   Stroke Paternal Grandmother     Hypertension Paternal Grandfather    Diabetes Paternal Grandfather     Social History:  reports that he has quit smoking. His smoking use included cigarettes. He has never been exposed to tobacco smoke. He has never used smokeless tobacco. He reports that he does not currently use alcohol. He reports that he does not use drugs.  Physical Exam: BP (!) 144/90   Pulse 78   Temp 98.1 F (36.7 C) (Oral)   Resp 16   Ht 6\' 1"  (1.854 m)   Wt 120.7 kg   SpO2 98%   BMI 35.09 kg/m    Constitutional:  Alert and oriented, No acute distress. Cardiovascular: RRR Respiratory: CTA bilaterally GI: Abdomen is soft, nontender, nondistended, no abdominal masses   Assessment & Plan:   61 yo M with bothersome left spermatocele.  We discussed options including observation versus spermatocelectomy.  He is interested in surgery with his discomfort from the cysts.  We reviewed this is a 30-minute procedure performed with general anesthesia, and involves a small incision in the left hemiscrotum and removal of the cyst.  These are benign lesions.  There is a less than 5% risk of bleeding or infection, or recurrence of cyst in the future.  It can take 4 to 6 weeks for complete healing, and we recommend avoiding strenuous physical activity  for around 1 to 2 weeks after surgery.  Left spermatocelectomy  Legrand Rams, MD 07/28/2021  [facility] [address], [address] [place], [place] [address] [phone]

## 2021-07-28 NOTE — Transfer of Care (Signed)
Immediate Anesthesia Transfer of Care Note  Patient: Steven Nielsen  Procedure(s) Performed: SPERMATOCELECTOMY (Left: Scrotum)  Patient Location: PACU  Anesthesia Type:General  Level of Consciousness: sedated  Airway & Oxygen Therapy: Patient Spontanous Breathing and Patient connected to face mask oxygen  Post-op Assessment: Report given to RN and Post -op Vital signs reviewed and stable  Post vital signs: Reviewed and stable  Last Vitals:  Vitals Value Taken Time  BP 123/74 07/28/21 1558  Temp    Pulse 70 07/28/21 1601  Resp 17 07/28/21 1601  SpO2 98 % 07/28/21 1601  Vitals shown include unvalidated device data.  Last Pain:  Vitals:   07/28/21 1457  TempSrc: Oral  PainSc: 0-No pain         Complications: No notable events documented.

## 2021-07-29 NOTE — Anesthesia Postprocedure Evaluation (Signed)
Anesthesia Post Note  Patient: Steven Nielsen  Procedure(s) Performed: SPERMATOCELECTOMY (Left: Scrotum)  Patient location during evaluation: PACU Anesthesia Type: General Level of consciousness: awake and alert Pain management: pain level controlled Vital Signs Assessment: post-procedure vital signs reviewed and stable Respiratory status: spontaneous breathing, nonlabored ventilation, respiratory function stable and patient connected to nasal cannula oxygen Cardiovascular status: blood pressure returned to baseline and stable Postop Assessment: no apparent nausea or vomiting Anesthetic complications: no   No notable events documented.   Last Vitals:  Vitals:   07/28/21 1630 07/28/21 1649  BP: (!) 159/97 (!) 153/69  Pulse: 68 69  Resp: 18 16  Temp: (!) 36.3 C (!) 36 C  SpO2: 98% 99%    Last Pain:  Vitals:   07/28/21 1649  TempSrc: Temporal  PainSc: 6                  Lenard Simmer

## 2021-07-30 ENCOUNTER — Encounter: Payer: Self-pay | Admitting: Urology

## 2021-09-11 ENCOUNTER — Ambulatory Visit: Payer: Managed Care, Other (non HMO) | Admitting: Physician Assistant

## 2021-09-11 ENCOUNTER — Encounter: Payer: Self-pay | Admitting: Urology

## 2021-09-11 ENCOUNTER — Ambulatory Visit (INDEPENDENT_AMBULATORY_CARE_PROVIDER_SITE_OTHER): Payer: Managed Care, Other (non HMO) | Admitting: Urology

## 2021-09-11 VITALS — BP 155/88 | HR 80 | Ht 73.0 in | Wt 266.0 lb

## 2021-09-11 DIAGNOSIS — N4342 Spermatocele of epididymis, multiple: Secondary | ICD-10-CM

## 2021-09-11 NOTE — Progress Notes (Signed)
09/11/21 9:21 AM   Steven Nielsen August 04, 1960 [id number]  Referring provider:  Dione Housekeeper, MD [address] [place],  [place] [address]  Chief Complaint  Patient presents with   Wound Check    Urological history  Spermatocele  - Scrotal ultrasound dated 07/14/2021 visualized multiple left epididymal cysts consistent with a spermatocele, measuring up to 4 cm.  No solid testicular masses.   - Exam on 07/18/2021 with D Sninsky revealed a 4 cm tender left spermatocele, no other suspicious findings. - S/p left spermatocelectomy with Dr Richardo Hanks on 07/28/2021.    HPI: Steven Nielsen is a 61 y.o.male who presents today for a 6 week post-op follow-up.   He denies any drainage or swelling or pain. He denies any trouble urinating or erections. He has a little bit of pressure but otherwise no tenderness.  Patient denies any modifying or aggravating factors. Patient denies any gross hematuria, dysuria or suprapubic/flank pain.  Patient denies any fevers, chills, nausea or vomiting.    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, right    Tongue swelling 06/23/2020    Surgical History: Past Surgical History:  Procedure Laterality Date   AMPUTATION TOE Left 07/14/2020   Procedure: Left hallux amputation through proximal phalanx;  Surgeon: Toni Arthurs, MD;  Location: [facility];  Service: Orthopedics;  Laterality: Left;   APPENDECTOMY  1985   COLONOSCOPY  11/16/2013   COLONOSCOPY  12/22/2018   DIAGNOSTIC LAPAROSCOPY  2010   exploratory   IR FLUORO GUIDE CV LINE RIGHT  06/09/2020    SHOULDER ARTHROSCOPY Left 2013   fracture; pins and screws   SPERMATOCELECTOMY Left 07/28/2021   Procedure: SPERMATOCELECTOMY;  Surgeon: Sondra Come, MD;  Location: [facility];  Service: Urology;  Laterality: Left;   TONSILLECTOMY  1970    Home Medications:  Allergies as of 09/11/2021       Reactions   Ceftriaxone Other (See Comments)   Tongue swelling, lip swelling with rocephin and dapto   Daptomycin    Elevated CK myositis, ? Allergic rxn tongue and lip swelling   Sumatriptan Rash, Shortness Of Breath   Flushing, diaphoresis, & shortness of breath   Amoxicillin-pot Clavulanate Diarrhea   Nausea, diarrhea, headache        Medication List        Accurate as of September 11, 2021  9:21 AM. If you have any questions, ask your nurse or doctor.          amLODipine 5 MG tablet Commonly known as: NORVASC Take by mouth.   ascorbic acid 500 MG tablet Commonly known as: VITAMIN C Take 500 mg by mouth daily.   baclofen 10 MG tablet Commonly known as: LIORESAL Take 10 mg by mouth 2 (two) times daily.   cyanocobalamin 1000 MCG tablet Commonly known as: VITAMIN B12 Take 1,000 mcg by mouth daily.   DULoxetine 60 MG capsule Commonly known as: CYMBALTA Take by mouth.   Fish Oil 1000 MG Caps Take 1 capsule by mouth daily  with breakfast.   fluticasone 50 MCG/ACT nasal spray Commonly known as: FLONASE Place into the nose.   gabapentin 300 MG capsule Commonly known as: NEURONTIN Take by mouth.   loratadine 10 MG tablet Commonly known as: CLARITIN Take 10 mg by mouth daily as needed for allergies.   losartan 100 MG tablet Commonly known as: COZAAR Take 100 mg by mouth daily.   metFORMIN 750 MG 24 hr tablet Commonly known as: GLUCOPHAGE-XR Take 750 mg by mouth daily.   Nasal Spray 0.05 % Soln Place 1 spray into the nose daily as needed (allergies).   omeprazole 10 MG capsule Commonly known as: PRILOSEC Take by mouth.   SAW PALMETTO PO Take 1 capsule by mouth  daily.   TYLENOL 8 HOUR ARTHRITIS PAIN PO Take 1,300 mg by mouth every 8 (eight) hours as needed (pain).        Allergies:  Allergies  Allergen Reactions   Ceftriaxone Other (See Comments)    Tongue swelling, lip swelling with rocephin and dapto   Daptomycin     Elevated CK myositis, ? Allergic rxn tongue and lip swelling   Sumatriptan Rash and Shortness Of Breath    Flushing, diaphoresis, & shortness of breath   Amoxicillin-Pot Clavulanate Diarrhea    Nausea, diarrhea, headache    Family History: Family History  Problem Relation Age of Onset   Hyperlipidemia Mother    Stroke Father    Hyperlipidemia Father    Diabetes Father    Cancer Father    Hyperlipidemia Sister    Diabetes Sister    Cancer Paternal Uncle        colon   Stroke Paternal Grandmother    Hypertension Paternal Grandfather    Diabetes Paternal Grandfather     Social History:  reports that he has quit smoking. His smoking use included cigarettes. He has never been exposed to tobacco smoke. He has never used smokeless tobacco. He reports that he does not currently use alcohol. He reports that he does not use drugs.   Physical Exam: BP (!) 155/88 (BP Location: Left Arm, Patient Position: Sitting, Cuff Size: Large)   Pulse 80   Ht 6\' 1"  (1.854 m)   Wt 266 lb (120.7 kg)   BMI 35.09 kg/m   Constitutional:  Alert and oriented, No acute distress. HEENT: [place] AT, moist mucus membranes.  Trachea midline Cardiovascular: No clubbing, cyanosis, or edema. Respiratory: Normal respiratory effort, no increased work of breathing. GU:  No CVA tenderness.  No bladder fullness or masses.  Patient with uncircumcised phallus. Foreskin easily retracted  Urethral meatus is patent.  No penile discharge. No penile lesions or rashes. Scrotum without lesions, cysts, rashes and/or edema.  Bilateral hydroceles, testicles are located scrotally bilaterally. No masses are appreciated in the testicles. Left and right epididymis are  normal. Incision healed no swelling, drainage, or crepitus.  Neurologic: Grossly intact, no focal deficits, moving all 4 extremities. Psychiatric: Normal mood and affect.  Laboratory Data: Lab Results  Component Value Date   CREATININE 0.90 07/24/2021  I have reviewed the labs.   Pertinent Imaging N/A  Assessment & Plan:    Spermatocele  - S/p spermatocelectomy  - Incisions are healing well   Return if symptoms worsen or fail to improve.  [facility] [address] [place], [place] [phone]  I, [phone] [place],acting as a scribe for [facility], PA-C.,have documented all relevant documentation on the behalf of [facility], PA-C,as directed by  Clemencia Helzer, PA-C while in the presence of Earle Troiano, PA-C.  I have reviewed the above documentation for accuracy and completeness, and I agree with the above.    Michiel Cowboy, PA-C

## 2022-08-22 IMAGING — MR MR LUMBAR SPINE W/O CM
5 series · 31 of 48 positions shown · non-contrast
Comparison: 03/14/2011 radiography.

CLINICAL DATA: Chronic neck and low back pain with bilateral arm
and leg numbness and tingling.

EXAM:
MRI LUMBAR SPINE WITHOUT CONTRAST
TECHNIQUE: Multiplanar, multisequence MR imaging of the lumbar spine was
performed. No intravenous contrast was administered.

[Series 5: T2 · sagittal · 4.0mm · 0.88mm/px · 6 of 17 slices shown (1 of 2)]
[im 1/17]
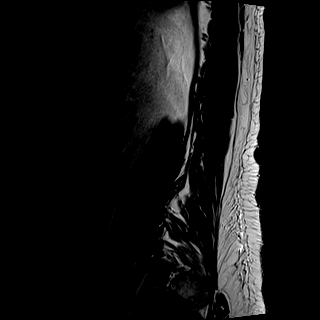
[im 4/17]
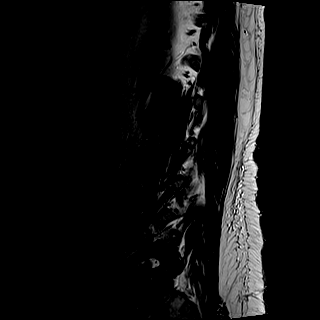
[im 7/17]
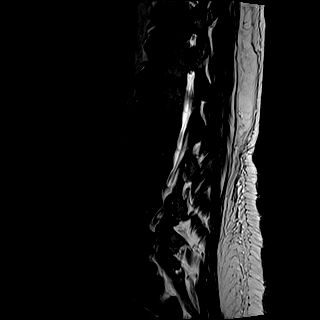
[im 10/17]
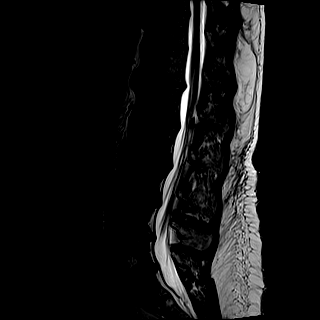
[im 13/17]
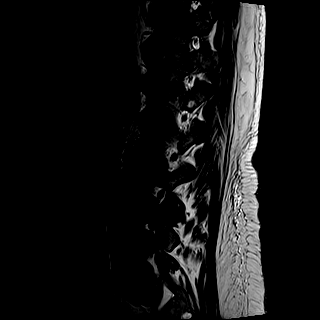
[im 17/17]
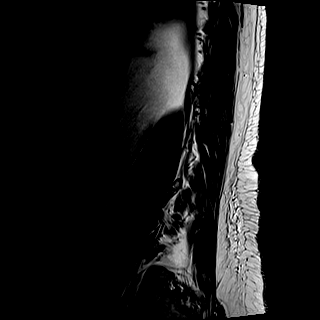

[Series 6: T1 · sagittal · 4.0mm · 0.88mm/px · 7 of 17 slices shown (1 of 2)]
[im 1/17]
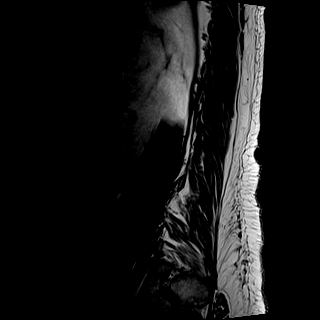
[im 3/17]
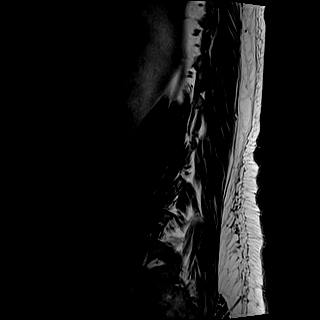
[im 6/17]
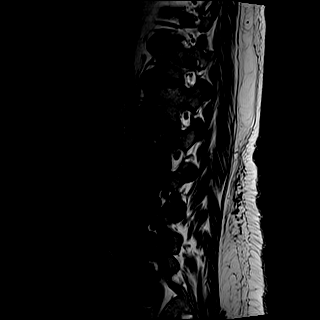
[im 9/17]
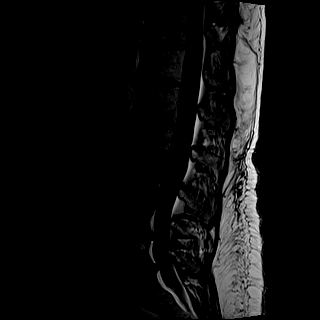
[im 11/17]
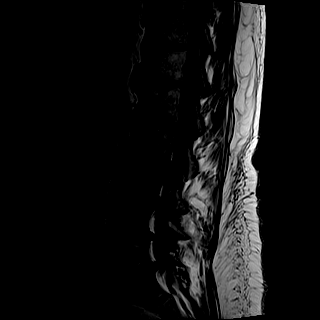
[im 14/17]
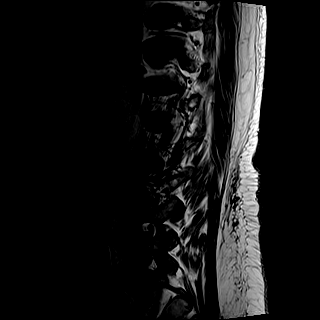
[im 17/17]
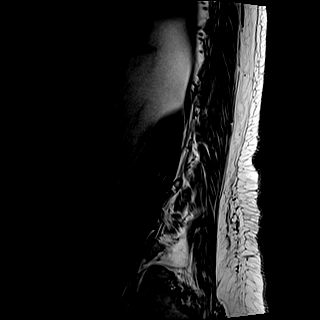

[Series 7: STIR · sagittal · 4.0mm · 0.44mm/px · 2 of 17 slices shown]
[im 1/17]
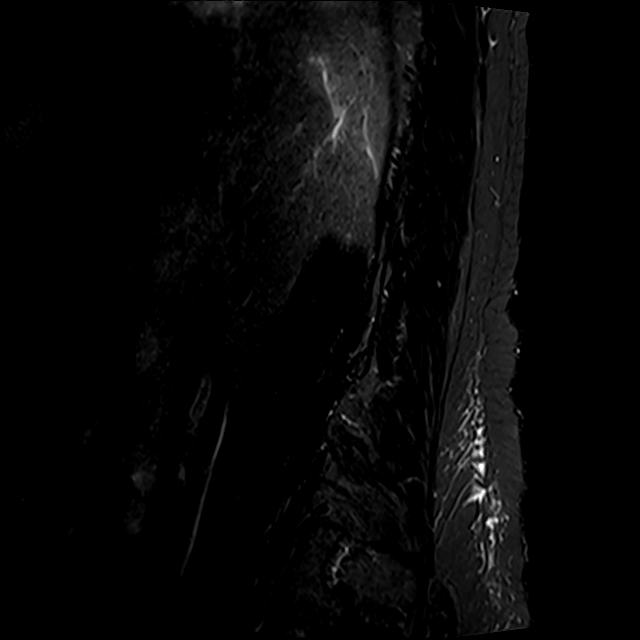
[im 3/17]
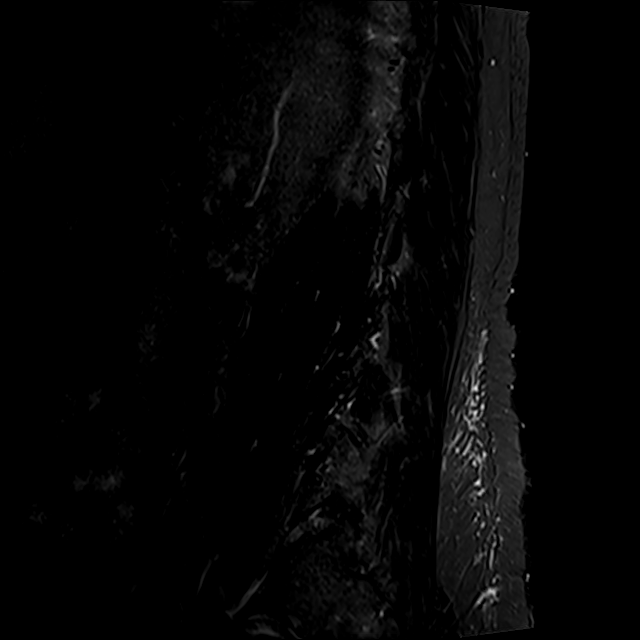

[Series 8: T2 · axial · 4.0mm · 0.78mm/px · z∈[-160,+46]mm · 8 of 34 slices shown (2 of 2)]
[im 1/34]
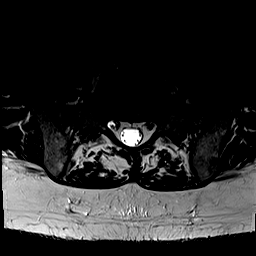
[im 6/34]
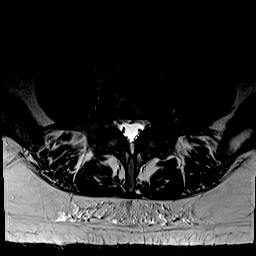
[im 11/34]
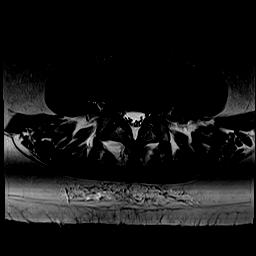
[im 16/34]
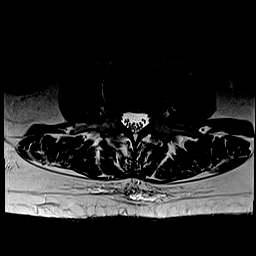
[im 18/34]
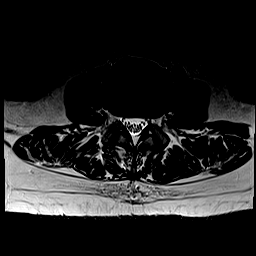
[im 23/34]
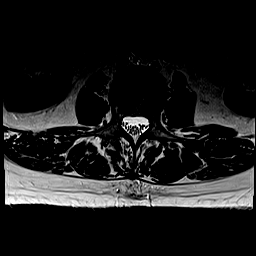
[im 28/34]
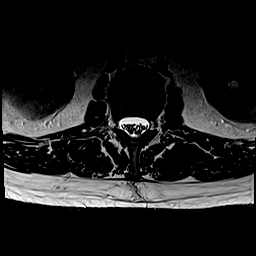
[im 34/34]
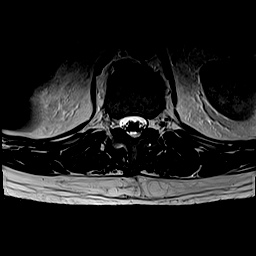

[Series 9: T1 · axial · 4.0mm · 0.39mm/px · z∈[-160,+46]mm · 8 of 34 slices shown (2 of 2)]
[im 1/34]
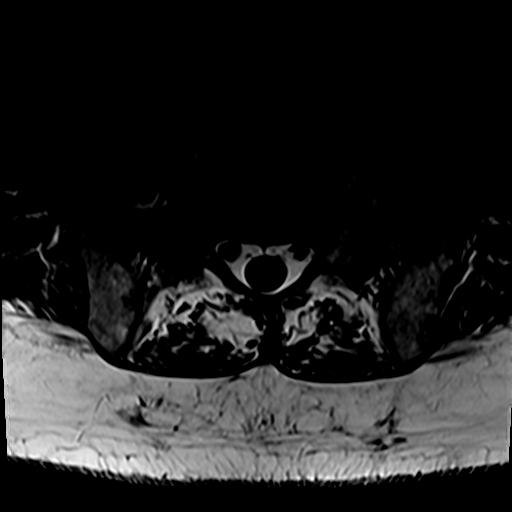
[im 6/34]
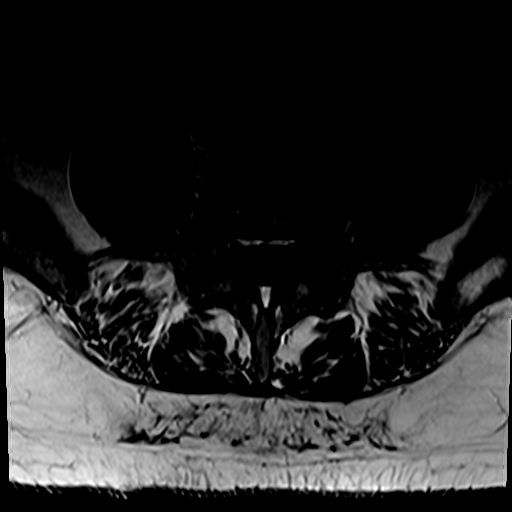
[im 11/34]
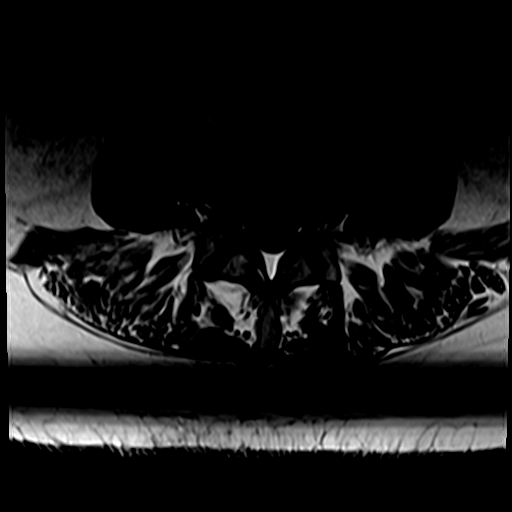
[im 16/34]
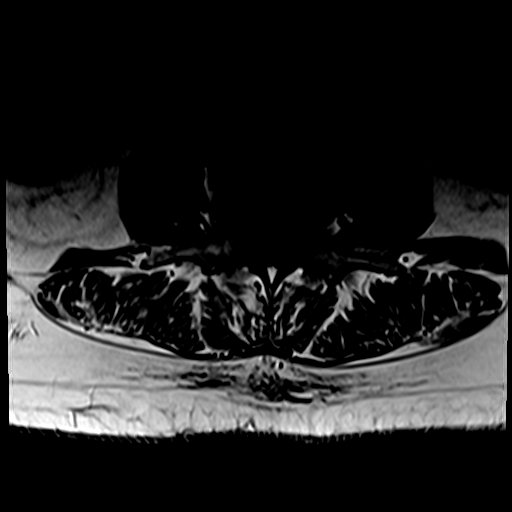
[im 18/34]
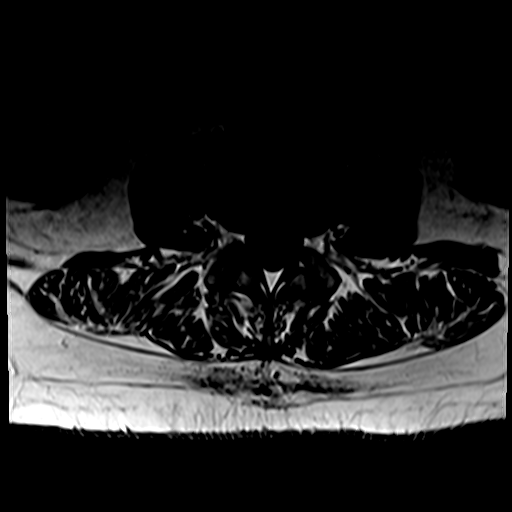
[im 23/34]
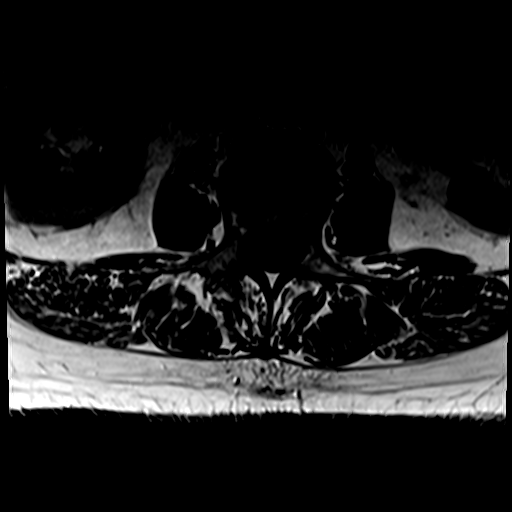
[im 28/34]
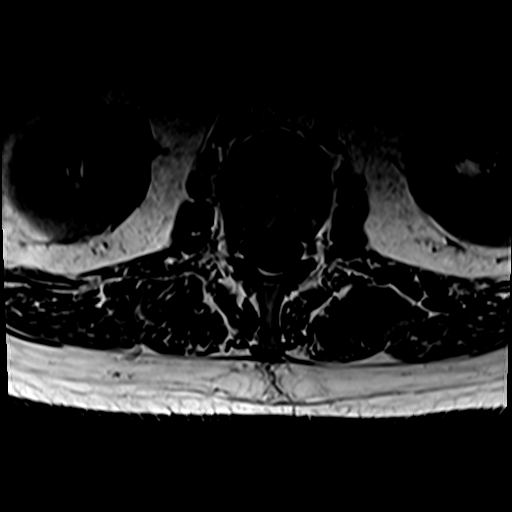
[im 34/34]
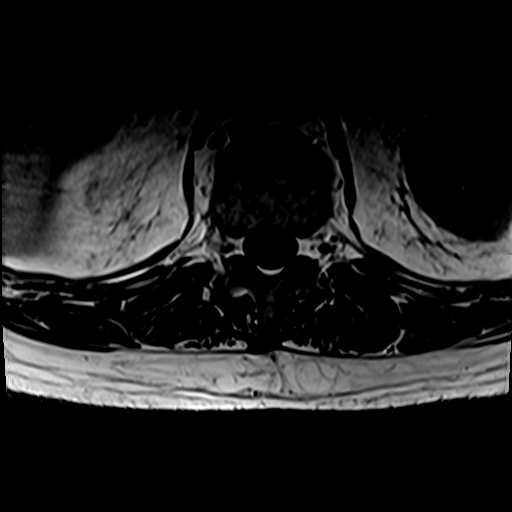

[31 of 48 positions shown; findings below may reference images not displayed]

FINDINGS: Segmentation:  5 lumbar type vertebral bodies.

Alignment:  Normal

Vertebrae: No significant primary bone finding. Benign hemangioma
within the L5 vertebral body and L2 vertebral body. Chronic endplate
Schmorl's nodes. Chronic endplate fatty marrow changes at L4-5.

Conus medullaris and cauda equina: Conus extends to the L1-2 level.
Conus and cauda equina appear normal.

Paraspinal and other soft tissues: Negative

Disc levels:

Mild non-compressive disc bulges from T10-11 through L2-3.

L3-4: Endplate osteophytes and mild bulging of the disc. Mild
narrowing of both lateral recesses but no likely neural compression.

L4-5: Disc degeneration with loss of disc height, endplate
osteophytes and bulging of the disc. Mild facet and ligamentous
prominence. Mild stenosis of both lateral recesses. Some potential
that either L5 nerve could be affected in this location. L4 nerves
are able to exit the foramina without compression.

L5-S1: Mild bulging of the disc.  No stenosis.
IMPRESSION: Degenerative disc disease throughout the lower thoracic and lumbar
region, without evidence of a disc herniation or any central canal
stenosis. Mild bilateral lateral recess narrowing at L3-4 and mild
to moderate bilateral lateral recess narrowing at L4-5. Some
potential that either or both L5 nerves could be affected in the
lateral recess at L4-5.
